# Patient Record
Sex: Male | Born: 1970 | Race: White | Hispanic: No | Marital: Married | Smoking: Current every day smoker
Health system: Southern US, Community
[De-identification: ages and names within clinical notes are randomized; demographics above are authoritative.]

## PROBLEM LIST (undated history)

## (undated) DIAGNOSIS — Z8719 Personal history of other diseases of the digestive system: Secondary | ICD-10-CM

## (undated) DIAGNOSIS — M1990 Unspecified osteoarthritis, unspecified site: Secondary | ICD-10-CM

## (undated) DIAGNOSIS — N400 Enlarged prostate without lower urinary tract symptoms: Secondary | ICD-10-CM

## (undated) DIAGNOSIS — R319 Hematuria, unspecified: Secondary | ICD-10-CM

## (undated) DIAGNOSIS — N200 Calculus of kidney: Secondary | ICD-10-CM

## (undated) DIAGNOSIS — Q8789 Other specified congenital malformation syndromes, not elsewhere classified: Secondary | ICD-10-CM

## (undated) DIAGNOSIS — F17200 Nicotine dependence, unspecified, uncomplicated: Secondary | ICD-10-CM

## (undated) HISTORY — DX: Other specified congenital malformation syndromes, not elsewhere classified: Q87.89

## (undated) HISTORY — DX: Calculus of kidney: N20.0

## (undated) HISTORY — PX: MOUTH SURGERY: SHX715

## (undated) HISTORY — PX: OTHER SURGICAL HISTORY: SHX169

## (undated) HISTORY — DX: Unspecified osteoarthritis, unspecified site: M19.90

## (undated) HISTORY — DX: Personal history of other diseases of the digestive system: Z87.19

## (undated) HISTORY — DX: Nicotine dependence, unspecified, uncomplicated: F17.200

## (undated) HISTORY — PX: HEMORRHOID SURGERY: SHX153

## (undated) HISTORY — DX: Hematuria, unspecified: R31.9

## (undated) HISTORY — DX: Benign prostatic hyperplasia without lower urinary tract symptoms: N40.0

---

## 2012-11-23 ENCOUNTER — Ambulatory Visit: Payer: Self-pay | Admitting: Emergency Medicine

## 2012-11-23 LAB — DOT URINE DIP
Glucose,UR: NEGATIVE mg/dL (ref 0–75)
Protein: NEGATIVE

## 2013-01-25 ENCOUNTER — Ambulatory Visit: Payer: Self-pay | Admitting: Surgery

## 2013-01-26 LAB — PATHOLOGY REPORT

## 2014-08-29 ENCOUNTER — Ambulatory Visit: Payer: Self-pay | Admitting: Urology

## 2014-09-15 ENCOUNTER — Ambulatory Visit: Payer: Self-pay | Admitting: Family Medicine

## 2014-09-15 LAB — DOT URINE DIP
GLUCOSE, UR: NEGATIVE
Protein: NEGATIVE
SPECIFIC GRAVITY: 1.025 (ref 1.000–1.030)

## 2014-12-01 ENCOUNTER — Ambulatory Visit: Payer: Self-pay | Admitting: Urology

## 2014-12-14 ENCOUNTER — Ambulatory Visit: Payer: Self-pay | Admitting: Urology

## 2014-12-16 DIAGNOSIS — N200 Calculus of kidney: Secondary | ICD-10-CM

## 2014-12-16 HISTORY — DX: Calculus of kidney: N20.0

## 2015-01-16 ENCOUNTER — Ambulatory Visit: Payer: Self-pay

## 2015-01-23 ENCOUNTER — Ambulatory Visit: Payer: Self-pay | Admitting: Urology

## 2015-01-23 LAB — URINALYSIS, COMPLETE
BACTERIA: NONE SEEN
Bilirubin,UR: NEGATIVE
GLUCOSE, UR: NEGATIVE mg/dL (ref 0–75)
Ketone: NEGATIVE
Leukocyte Esterase: NEGATIVE
Nitrite: NEGATIVE
Ph: 5 (ref 4.5–8.0)
Protein: NEGATIVE
RBC,UR: 31 /HPF (ref 0–5)
SPECIFIC GRAVITY: 1.018 (ref 1.003–1.030)
Squamous Epithelial: NONE SEEN

## 2015-02-06 ENCOUNTER — Ambulatory Visit: Payer: Self-pay | Admitting: Urology

## 2015-02-20 ENCOUNTER — Ambulatory Visit: Payer: Self-pay | Admitting: Urology

## 2015-03-13 DIAGNOSIS — L299 Pruritus, unspecified: Secondary | ICD-10-CM | POA: Insufficient documentation

## 2015-03-22 ENCOUNTER — Ambulatory Visit
Admit: 2015-03-22 | Disposition: A | Discharge status: Home / Self Care | Payer: Self-pay | Attending: Urology | Admitting: Urology

## 2015-04-07 NOTE — Op Note (Signed)
PATIENT NAME:  [name], MAIN MR#:  [id number] DATE OF BIRTH:  01/16/1971  DATE OF PROCEDURE:  01/25/2013  PREOPERATIVE DIAGNOSIS: Hemorrhoids.   POSTOPERATIVE DIAGNOSIS: Hemorrhoids.   PROCEDURE: Hemorrhoidectomy.   SURGEON: Rochel Brome, MD  ANESTHESIA: General.   INDICATIONS: This 44 year old male has a long history of bleeding hemorrhoids. Physical exam demonstrated large internal and external hemorrhoids and surgery was recommended for definitive treatment.   DESCRIPTION OF PROCEDURE: The patient was placed on the operating table in the supine position under general anesthesia. The legs were elevated into the lithotomy position using ankle straps. The anal area was prepared with Betadine solution and draped in a sterile manner.   Initial inspection revealed there was a large rosette of external hemorrhoids. The largest complex was on the left, the next largest was right and posterior and then next largest was right and anterior. The anoderm was infiltrated with 0.5% Sensorcaine with epinephrine. The anal canal was dilated large enough to admit four fingers. Some formed stool was evacuated. The bivalve anal retractor was introduced and the anal canal was further dilated. Multiple large internal hemorrhoids were identified and the largest internal hemorrhoids were on the patient's left.   The first internal and external hemorrhoid column to be removed was on the patient's left. A high ligation of the internal component was done with a 2-0 chromic suture ligature. A V-shaped incision was made externally and initial dissection was carried out with scissors to separate external hemorrhoids from the skin and from some surrounding subcutaneous fat. Next, further dissection was carried out with the Harmonic scalpel as the external hemorrhoids were excised. The internal anal sphincter was identified and care was taken to avoid injury to the sphincter. The dissection was carried out beyond the sphincter  up to the previously placed suture ligature. The hemorrhoid was again ligated with the same suture ligature and the hemorrhoid was excised with the Harmonic scalpel. The wound was inspected. Several small bleeding points were cauterized. The wound was closed with a running, locked tied 2-0 chromic with a small opening externally left for drainage.   Next, a similar procedure was carried out at the 8 o'clock position with similar excision, ligation and closure.  Next, a similar procedure was carried out at the 11 o'clock position, this hemorrhoid was somewhat smaller, with similar excision, ligation and closure. Two other internal hemorrhoids were ligated with 2-0 chromic. Following this it appeared hemostasis was intact. The site was further prepared with Betadine and skin and subcutaneous tissues and deeper tissues surrounding the sphincter were infiltrated with 20 mL of Exparel. Dressings were applied with paper tape. The patient tolerated surgery satisfactorily and was then prepared for transfer to the recovery room. ____________________________ Lenna Sciara. Rochel Brome, MD jws:sb D: 01/25/2013 11:25:00 ET T: 01/25/2013 11:45:09 ET JOB#: [id number]  cc: Loreli Dollar, MD, <Dictator> Loreli Dollar MD ELECTRONICALLY SIGNED 01/30/2013 20:38

## 2015-04-16 NOTE — Op Note (Signed)
**Note Wesley-Identified via Obfuscation** PATIENT NAME:  [name], [name] MR#:  [id number] DATE OF BIRTH:  1971-06-20  DATE OF PROCEDURE:  01/23/2015  PREOPERATIVE DIAGNOSIS:  Left kidney stone.   POSTOPERATIVE DIAGNOSIS:  Left kidney stone.   PROCEDURE PERFORMED: Left ureteroscopy, left ureteral stent placement.   ATTENDING SURGEON: Sherlynn Stalls, MD.   ANESTHESIA: General anesthesia.   ESTIMATED BLOOD LOSS: Minimal.   DRAINS: A 6 x 26 double-J ureteral stent on the left.   SPECIMENS: None.   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   INDICATION: This is a 44 year old male with significant left lower pole stones x 3 measuring up to 1 cm. He underwent ESWL, however these failed to fragment. He was counseled on various treatment options and has elected to proceed with left ureteroscopy for definitive management of his stones. Risks and benefits including the risk of need for multiple procedures were discussed in the preoperative holding area, to which the patient agreed.   PROCEDURE: The patient was correctly identified in the preoperative holding area and informed consent was confirmed. He was brought to the operating suite and placed on the table in the supine position. At this time universal timeout protocol was performed. All team members were identified. Venodyne boots placed and he was administered 500 mg of IV Levaquin in the perioperative period. He was then placed under general anesthesia, repositioned in the dorsal lithotomy position, and prepped and draped in standard surgical fashion. At this point in time a rigid cystoscope with a 21 French access sheath was introduced into the bladder and attention was turned to the left ureteral orifice. This was cannulated using a 5 [place] open-ended ureteral catheter and Sensor wire was able to be placed up to the level of the kidney without difficulty.  I then attempted to use a dual-lumen to introduce a second  Sensor wire which was successful up to the level of the kidney. Given the stone burden I did elect to go ahead and attempt to place a 12-14 access sheath, however met resistance at the level of the mid ureter just below the level of the iliacs. I then used the inner cannula of the access sheath alone and again was unable to pass it past this area. At this point in time due to concern for possible obstruction at this area retrograde pyelogram was performed, which revealed no obvious stenosis in this area or evidence of extravasation or proximal dilation.  I then attempted to place a flexible ureteroscope which was 6 [place] in diameter up over the working wire past this area, again which was unsuccessful. This was performed under direct visualization and also under fluoroscopic guidance without success.  I then used a needle ureteroscope which was a rigid ureteroscope to scope the distal ureter.  I was able to advance this through the distal ureter without difficulty and again I was able to visualize the area just distal to the iliac crest by which the access sheath or flexible scope failed to pass, and it appeared to be patent, however due to torquing I was unable to place the rigid scope past this area either, which was somewhat curious. I attempted several additional maneuvers, including using a Super Stiff wire as my working wire, and again ultimately was unsuccessful in getting the scope past this area. I then elected to place a 6 x 26 [place] after backloading the safety wire through the cystoscope. The stent did advance past this  area up into the level of the kidney without difficulty. The wire was partially withdrawn. A coil was noted within the renal pelvis. The wire was then fully withdrawn and coil was noted within the bladder. The bladder was then drained. The patient was repositioned in the supine position and taken to the PACU in stable condition.   PLAN: We will go ahead and plan for a staged procedure  and bring the patient back in a few weeks for ureteroscopy. I explained to both the patient and his wife that after passive ureteral dilation it is often very easy to pass the scope and it should be successful at the next procedure.     ____________________________ Sherlynn Stalls, MD ajb:bu D: 01/23/2015 14:29:03 ET T: 01/23/2015 16:15:27 ET JOB#: [id number]  cc: Sherlynn Stalls, MD, <Dictator> Sherlynn Stalls MD ELECTRONICALLY SIGNED 02/07/2015 12:35

## 2015-04-16 NOTE — Op Note (Signed)
PATIENT NAME:  [name], [name] MR#:  [id number] DATE OF BIRTH:  Apr 24, 1971  DATE OF PROCEDURE:  02/06/2015  PREOPERATIVE DIAGNOSIS: Left kidney stones.   POSTOPERATIVE DIAGNOSIS:  Left kidney stones.   PROCEDURE PERFORMED: Left ureteroscopy, laser lithotripsy, and left ureteral stent exchange.  ANESTHESIA: General anesthesia.   ATTENDING SURGEON: Sherlynn Stalls, MD  SPECIMEN: Stone fragment.   DRAINS: A 6 x 26 double-J ureteral stent on the left.   COMPLICATIONS: None.  ESTIMATED BLOOD LOSS:  Minimal.   INDICATION: This is a 44 year old male with 3 large left lower pole stones measuring 8 mm, 5 mm, and 5 mm in size. He previously underwent ESWL without any change in the size or shape of his stones. He is brought to the operating suite a few weeks ago for attempted left ureteroscopy but was unable to pass the scope beyond a ureteral stricture just below the level of the iliac crest, therefore a ureteral stent was placed. He returns today for definitive management of his stones. Risks and benefits of the procedure were explained in detail. The patient agreed to proceed as planned.   PROCEDURE: The patient was correctly identified in the preoperative holding area and informed consent was confirmed. He was brought to the operating suite and placed on the table in the supine position. At this time universal timeout protocol was performed. The team members were identified. Venodyne boots were placed and he was administered 2 grams of IV Ancef in the perioperative period. He was administered IV Levaquin in the perioperative period. He was then placed under general anesthesia, repositioned lower on the bed in the dorsal lithotomy position and prepped and draped in standard surgical fashion. At this point in time, a rigid cystoscope using the 54 French access sheath was advanced per urethra into the bladder. The bladder was surveyed. Attention was turned to the left ureteral orifice in the distal coil  of the stent was grasped and brought out to the level of the urethral meatus. This was then cannulated using a 0.038 Sensor wire up to the level of the kidney. The stent was removed and the wire was left in place. A dual-lumen access sheath was introduced just within the distal ureter and a second Super Stiff wire was introduced at this time. The Sensor wire was snapped in place and the Super Stiff was used as a working wire. A 1214 Cook access sheath was then attempted to be advanced over the wire, however, was not able to advance it beyond the level of the iliac crest in the same area of the ureter, which I had difficulty getting past on the last case. The inner lumen was then removed and I was fortunately able to pass a 10 French flexible ureteroscope beyond this area to the kidney. Of note, prior to passing the scope, I did perform a retrograde pyelogram and again did not appear to be an obvious stricture, but for whatever reason this area was quite tight and did not distend like the other portions of the ureter. Once up in the kidney all calyces were surveyed. Three large stones were identified in the lower pole of the kidney. A 273 micron fiber was then brought in and used in the settings of 0.2 joules and 50 Hz. The stone was dusted into very small pieces. This took quite some time as there was a very significant stone burden.  Dusting occurred over greater than an hour period of time. Because of the narrowed area I had  difficult time passing the stone, so I did attempt to and create as small fragments as possible. The stone fragments where either the size of the laser tip or slightly larger but there was a very large stone fragment burden.  I attempted to aspirate out some of the dust but this was not particularly successful. I then used the basket a few times to basket out pieces but given the stricture and difficulty getting the scope in and out, I was not able to this efficiently or particularly  effectively. At this point in time, I did decide to go ahead and proceed with staged procedure and allow a few weeks for him to pass much of the debris as visualization at this point was fairly poor and return in a few weeks to extract out the residual stone burden. The access sheath was removed under direct visualization and a Sensor wire was back loaded into a rigid cystoscope and a 6 x 26 French double-J ureteral stent was advanced over the wire up to the level of the kidney. The wire was partially withdrawn and a coil was noted within the renal pelvis. The wire was then fully withdrawn and the coil was noted within the bladder. The bladder was then drained. The patient was repositioned in the supine position, reversed from anesthesia and taken to the PACU in stable condition. There were no complications in this case.   ____________________________ Sherlynn Stalls, MD ajb:mc D: 02/06/2015 17:25:04 ET T: 02/07/2015 09:08:33 ET JOB#: [id number]  cc: Sherlynn Stalls, MD, <Dictator> Sherlynn Stalls MD ELECTRONICALLY SIGNED 02/22/2015 13:39

## 2015-04-16 NOTE — Op Note (Signed)
PATIENT NAME:  [name], [name] MR#:  [id number] DATE OF BIRTH:  12/09/1971  DATE OF PROCEDURE:  02/20/2015  PREOPERATIVE DIAGNOSIS: Left kidney stones.   POSTOPERATIVE DIAGNOSIS: Left kidney stones.  PROCEDURE PERFORMED: Left ureteroscopy, laser lithotripsy, left ureteral stent exchange, retrograde pyelogram   ATTENDING SURGEON: Sherlynn Stalls, MD   ANESTHESIA: General anesthesia.   ESTIMATED BLOOD LOSS: Minimal.   DRAINS: A 6 x 26 French double-J ureteral stent on the left.   COMPLICATIONS: None.   SPECIMENS: Stone fragment.   INDICATION: This is a 44 year old male who has undergone several ureteroscopic procedures for a large left stone burden. He was last in the operating room approximately 2 weeks ago and these large stones were fragmented, but there was fairly significant residual stone at the end of the case. He returns today for a staged procedure. Risks and benefits of the procedure were explained in detail with the patient who agreed to proceed as planned.   PROCEDURE: The patient was correctly identified in the preoperative holding area and informed consent was confirmed. He was brought to the operating suite and placed on the table in the supine position. At this time, a universal timeout protocol was performed. All team members were identified. Venodyne boots were placed and he was administered IV ampicillin and gentamicin in the perioperative period. He was then placed under general anesthesia, repositioned lower in the dorsal lithotomy position and prepped and draped in the standard surgical fashion. At this point in time, a rigid cystoscope using a 18 French access sheath was advanced per urethra into the bladder. Attention was turned to the left ureteral orifice from which a ureteral stent was seen emanating. The distal coil was grabbed using stent graspers and brought out to the level of the urethral meatus. The stent was then cannulated using a Sensor wire up to the level of  the kidney. The stent was then removed leaving the wire in place. This was snapped in place to be used as a working wire. A second Super Stiff wire was introduced using the cystoscope up to the level of the renal pelvis to be used as a working wire. Given the large stone burden, I did go ahead and elect to use [facility] ureteral access sheath. This was advanced over the Super Stiff wire to the level of the proximal ureter and the inner cannula was removed. A 7 French rigid ureteroscope was advanced into the kidney and the kidneys were surveyed. There were no significant stones in the upper and mid pole; however, there was a plethora of very small stone fragments within the lower pole complex. A 1.9 French Nitinol tipless basket was then used to basket a very large number of these stones, approximately 30 to 40 small stone fragments. There were no significantly large stones which were not amenable to basketing as this had been adequately fragmented at the last procedure; therefore, there was no need for laser lithotripsy today. After a lengthy basket extraction, the entire kidney was noted to be clear of any significant sized stones with a very minimal minute stone burden of extremely tiny fragments. Once this was completed, the scope was backed down to the level of the UPJ and a retrograde pyelogram was performed. This created a map to ensure that each and every calyx was directly visualized and there were no significant stones remaining. Once this was complete, the scope was removed under direct visualization as well as a ureteral access sheath and the entire ureter was examined  under direct visualization upon removal. The safety wire was then backloaded over a rigid cystoscope and a 6 x 24 French double-J ureteral stent was placed over the wire up to the level of the renal pelvis. The wire was partially withdrawn and a coil was noted within the bladder. The wire was then fully withdrawn and a coil was noted within the  bladder. The bladder was then drained. The patient was then repositioned in the supine position, reversed from anesthesia, and taken to the PACU in stable condition. There were no complications in this case.    ____________________________ Sherlynn Stalls, MD ajb:TM D: 02/20/2015 16:59:18 ET T: 02/20/2015 20:15:18 ET JOB#: [id number]  cc: Sherlynn Stalls, MD, <Dictator> Sherlynn Stalls MD ELECTRONICALLY SIGNED 03/22/2015 16:20

## 2015-07-31 ENCOUNTER — Other Ambulatory Visit: Payer: Self-pay

## 2015-07-31 DIAGNOSIS — N200 Calculus of kidney: Secondary | ICD-10-CM

## 2015-11-13 DIAGNOSIS — K219 Gastro-esophageal reflux disease without esophagitis: Secondary | ICD-10-CM | POA: Insufficient documentation

## 2015-11-20 DIAGNOSIS — Z720 Tobacco use: Secondary | ICD-10-CM | POA: Insufficient documentation

## 2015-11-20 DIAGNOSIS — IMO0002 Reserved for concepts with insufficient information to code with codable children: Secondary | ICD-10-CM | POA: Insufficient documentation

## 2016-03-26 ENCOUNTER — Encounter: Payer: Self-pay | Admitting: *Deleted

## 2016-03-26 ENCOUNTER — Ambulatory Visit
Admission: RE | Admit: 2016-03-26 | Discharge: 2016-03-26 | Disposition: A | Discharge status: Home / Self Care | Payer: BLUE CROSS/BLUE SHIELD | Source: Ambulatory Visit | Attending: Urology | Admitting: Urology

## 2016-03-26 ENCOUNTER — Ambulatory Visit (INDEPENDENT_AMBULATORY_CARE_PROVIDER_SITE_OTHER): Payer: BLUE CROSS/BLUE SHIELD | Admitting: Urology

## 2016-03-26 VITALS — BP 116/72 | HR 73 | Ht 69.5 in | Wt 232.6 lb

## 2016-03-26 DIAGNOSIS — N200 Calculus of kidney: Secondary | ICD-10-CM | POA: Insufficient documentation

## 2016-03-26 DIAGNOSIS — Z87898 Personal history of other specified conditions: Secondary | ICD-10-CM | POA: Insufficient documentation

## 2016-03-26 DIAGNOSIS — Z87442 Personal history of urinary calculi: Secondary | ICD-10-CM

## 2016-03-26 DIAGNOSIS — Q8501 Neurofibromatosis, type 1: Secondary | ICD-10-CM | POA: Insufficient documentation

## 2016-03-26 LAB — URINALYSIS, COMPLETE
BILIRUBIN UA: NEGATIVE
Glucose, UA: NEGATIVE
KETONES UA: NEGATIVE
Leukocytes, UA: NEGATIVE
Nitrite, UA: NEGATIVE
PH UA: 7.5 (ref 5.0–7.5)
Protein, UA: NEGATIVE
RBC UA: NEGATIVE
Specific Gravity, UA: 1.02 (ref 1.005–1.030)
UUROB: 0.2 mg/dL (ref 0.2–1.0)

## 2016-03-26 LAB — MICROSCOPIC EXAMINATION
Bacteria, UA: NONE SEEN
EPITHELIAL CELLS (NON RENAL): NONE SEEN /HPF (ref 0–10)
RBC, UA: NONE SEEN /hpf (ref 0–?)

## 2016-03-26 NOTE — Progress Notes (Signed)
03/26/2016 5:56 PM   [name]. 30-Jan-1971 [id number]  Referring provider: No referring provider defined for this encounter.  Chief Complaint  Patient presents with  . Follow-up    renal stone    HPI: 45 year old male who returns approximately one year following left ureteroscopy, laser lithotripsy and failed a left ESWL for fairly significant left-sided stone burden. He did have follow-up renal ultrasound which was negative for residual stones and hydronephrosis. Stone analysis was consistent with 95% calcium oxalate monohydrate, 2% calcium oxalate dihydrate, and 2% calcium phosphate stones.  Today, he has no complains. He experienced no further flank pain.  No recent gross hematuria.  He continues to not drink enough water and has not been checked her collate adherent to a stone diet. 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  KUB today shows no new stone formation.      PMH: Past Medical History  Diagnosis Date  . Neurofibromatosis type 1-like syndrome   . Hematuria     microscopic  . Arthritis   . History of hemorrhoids   . Kidney stones   . Smoker   . BPH (benign prostatic hyperplasia)     Surgical History: Past Surgical History  Procedure Laterality Date  . Acl reconstructed    . Hemorrhoid surgery    . Mouth surgery      Home Medications:    Medication List       This list is accurate as of: 03/26/16  5:56 PM.  Always use your most recent med list.               atorvastatin 40 MG tablet  Commonly known as:  LIPITOR  Take by mouth.     Cranberry 500 MG Caps  Take by mouth.     gabapentin 100 MG capsule  Commonly known as:  NEURONTIN     Turmeric Curcumin 500 MG Caps  Take by mouth.        Allergies: No Known Allergies  Family History: Family History  Problem Relation Age of Onset  . Stroke Father   . Depression Mother   . Coronary artery disease      mother,father,maternal side,paternal side    . Diabetes Mellitus II Father     Social History:  reports that he has been smoking.  He does not have any smokeless tobacco history on file. He reports that he drinks alcohol. His drug history is not on file.  ROS: UROLOGY Frequent Urination?: No Hard to postpone urination?: No Burning/pain with urination?: No Get up at night to urinate?: No Leakage of urine?: No Urine stream starts and stops?: Yes Trouble starting stream?: No Do you have to strain to urinate?: No Blood in urine?: No Urinary tract infection?: No Sexually transmitted disease?: No Injury to kidneys or bladder?: No Painful intercourse?: No Weak stream?: No Erection problems?: No Penile pain?: No  Gastrointestinal Nausea?: No Vomiting?: No Indigestion/heartburn?: No Diarrhea?: No Constipation?: No  Constitutional Fever: No Night sweats?: No Weight loss?: No Fatigue?: No  Skin Skin rash/lesions?: No Itching?: No  Eyes Blurred vision?: No Double vision?: No  Ears/Nose/Throat Sore throat?: No Sinus problems?: No  Hematologic/Lymphatic Swollen glands?: No Easy bruising?: No  Cardiovascular Leg swelling?: No Chest pain?: No  Respiratory Cough?: No Shortness of breath?: No  Endocrine Excessive thirst?: No  Musculoskeletal Back pain?: No Joint pain?: No  Neurological Headaches?: No Dizziness?: No  Psychologic Depression?: No Anxiety?: No  Physical  Exam: BP 116/72 mmHg  Pulse 73  Ht 5' 9.5" (1.765 m)  Wt 232 lb 9.6 oz (105.507 kg)  BMI 33.87 kg/m2  Constitutional:  Alert and oriented, No acute distress. HEENT: [place] AT, moist mucus membranes.  Trachea midline, no masses. Cardiovascular: No clubbing, cyanosis, or edema. Respiratory: Normal respiratory effort, no increased work of breathing. GI: Abdomen is soft, nontender, nondistended, no abdominal masses GU: No CVA tenderness Skin: No rashes, bruises or suspicious lesions. Neurologic: Grossly intact, no focal deficits,  moving all 4 extremities. Psychiatric: Normal mood and affect.  Laboratory Data:  Urinalysis Results for orders placed or performed in visit on 03/26/16  Microscopic Examination  Result Value Ref Range   WBC, UA 0-5 0 -  5 /hpf   RBC, UA None seen 0 -  2 /hpf   Epithelial Cells (non renal) None seen 0 - 10 /hpf   Crystals Present (A) N/A   Crystal Type Amorphous Sediment N/A   Bacteria, UA None seen None seen/Few  Urinalysis, Complete  Result Value Ref Range   Specific Gravity, UA 1.020 1.005 - 1.030   pH, UA 7.5 5.0 - 7.5   Color, UA Yellow Yellow   Appearance Ur Cloudy (A) Clear   Leukocytes, UA Negative Negative   Protein, UA Negative Negative/Trace   Glucose, UA Negative Negative   Ketones, UA Negative Negative   RBC, UA Negative Negative   Bilirubin, UA Negative Negative   Urobilinogen, Ur 0.2 0.2 - 1.0 mg/dL   Nitrite, UA Negative Negative   Microscopic Examination See below:     Pertinent Imaging:  Study Result     CLINICAL DATA: 45 year old male with history kidney stones. Follow-up. No pain or hematuria currently. Subsequent encounter.  EXAM: ABDOMEN - 1 VIEW  COMPARISON: 01/16/2015.  FINDINGS: No renal or ureteral calculi noted.  Normal bowel gas pattern.  IMPRESSION: No renal or ureteral calculi noted.   Electronically Signed  By: Genia Del M.D.  On: 03/26/2016 15:56        Assessment & Plan:    1. History of nephrolithiasis No recent flank pain or evidence of recurrent stone formation of the past year. Again, reviewed stone diet and importance of adequate hydration.   KUB reviewed today with patient.  He will lfollow-up as needed.  - Urinalysis, Complete    Hollice Espy, MD  [facility] [address] [place], [place] [address] [phone]

## 2016-03-27 ENCOUNTER — Ambulatory Visit: Payer: Self-pay | Admitting: Urology

## 2016-03-29 ENCOUNTER — Ambulatory Visit: Payer: Self-pay | Admitting: Urology

## 2016-07-22 ENCOUNTER — Encounter: Payer: Self-pay | Admitting: *Deleted

## 2016-07-22 ENCOUNTER — Ambulatory Visit
Admission: EM | Admit: 2016-07-22 | Discharge: 2016-07-22 | Disposition: A | Discharge status: Home / Self Care | Payer: BLUE CROSS/BLUE SHIELD | Attending: Family Medicine | Admitting: Family Medicine

## 2016-07-22 DIAGNOSIS — Z0289 Encounter for other administrative examinations: Secondary | ICD-10-CM

## 2016-07-22 LAB — DEPT OF TRANSP DIPSTICK, URINE (ARMC ONLY)
GLUCOSE, UA: NEGATIVE mg/dL
Hgb urine dipstick: NEGATIVE
PROTEIN: NEGATIVE mg/dL
Specific Gravity, Urine: 1.015 (ref 1.005–1.030)

## 2016-07-22 NOTE — Discharge Instructions (Signed)
Follow-up with primary care physician as scheduled, dentist, eye doctor.

## 2016-07-22 NOTE — ED Provider Notes (Signed)
[facility]    CSN: [id number] Arrival date & time: 07/22/16  1011  First Provider Contact:  None       History   Chief Complaint Chief Complaint  Patient presents with  . Commercial Driver's License Exam    HPI [name]. is a 45 y.o. male.   HPI Patient here for DOT physical. Has no acute concerns at this time.  Past Medical History:  Diagnosis Date  . Arthritis   . BPH (benign prostatic hyperplasia)   . Hematuria    microscopic  . History of hemorrhoids   . Kidney stones   . Neurofibromatosis type 1-like syndrome   . Smoker     Patient Active Problem List   Diagnosis Date Noted  . H/O disease 03/26/2016  . 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 Adult BMI 30+ 11/20/2015  . Current tobacco use 11/20/2015  . Acid reflux 11/13/2015  . Itch 03/13/2015    Past Surgical History:  Procedure Laterality Date  . acl reconstructed    . HEMORRHOID SURGERY    . MOUTH SURGERY         Home Medications    Prior to Admission medications   Medication Sig Start Date End Date Taking? Authorizing Provider  atorvastatin (LIPITOR) 40 MG tablet Take by mouth. 08/14/15 08/13/16 Yes Historical Provider, MD  Cranberry 500 MG CAPS Take by mouth.   Yes Historical Provider, MD  diclofenac (VOLTAREN) 75 MG EC tablet Take 75 mg by mouth 2 (two) times daily.   Yes Historical Provider, MD  Turmeric Curcumin 500 MG CAPS Take by mouth.   Yes Historical Provider, MD  gabapentin (NEURONTIN) 100 MG capsule  03/13/15   Historical Provider, MD    Family History Family History  Problem Relation Age of Onset  . Stroke Father   . Diabetes Mellitus II Father   . Depression Mother   . Coronary artery disease      mother,father,maternal side,paternal side    Social History Social History  Substance Use Topics  . Smoking status: Current Every Day Smoker  . Smokeless tobacco: Never Used  . Alcohol use 0.0 oz/week     Comment: Beer     Allergies     Review of patient's allergies indicates no known allergies.   Review of Systems Review of Systems negative except mentioned above.   Physical Exam Triage Vital Signs ED Triage Vitals [07/22/16 1130]  Enc Vitals Group     BP (!) 128/91     Pulse Rate 67     Resp 16     Temp 97.7 F (36.5 C)     Temp Source Oral     SpO2 98 %     Weight 228 lb (103.4 kg)     Height 5' 11"  (1.803 m)     Head Circumference      Peak Flow      Pain Score      Pain Loc      Pain Edu?      Excl. in [place]?    No data found.   Updated Vital Signs BP (!) 128/91 (BP Location: Left Arm)   Pulse 67   Temp 97.7 F (36.5 C) (Oral)   Resp 16   Ht 5' 11"  (1.803 m)   Wt 228 lb (103.4 kg)   SpO2 98%   BMI 31.80 kg/m        Physical Exam: Exam on form.   UC Treatments / Results  Labs (all labs ordered are listed, but only abnormal results are displayed) Labs Reviewed  DEPT OF TRANSP DIPSTICK, URINE([facility] ONLY)    EKG  EKG Interpretation None       Radiology No results found.  Procedures Procedures (including critical care time)  Medications Ordered in UC Medications - No data to display   Initial Impression / Assessment and Plan / UC Course  I have reviewed the triage vital signs and the nursing notes.  Pertinent labs & imaging results that were available during my care of the patient were reviewed by me and considered in my medical decision making (see chart for details).  Clinical Course   A/P: DOT physical- form reviewed and filled out. Recommend follow up with primary care physician, dentist, eye doctor on a regular basis.  Final Clinical Impressions(s) / UC Diagnoses   Final diagnoses:  Encounter for examination required by Department of Transportation (DOT)    New Prescriptions New Prescriptions   No medications on file     Paulina Fusi, MD 07/22/16 1250

## 2016-07-22 NOTE — ED Triage Notes (Signed)
Here for DOT physical.

## 2016-08-19 IMAGING — CR DG ABDOMEN 1V
1 series · 1 of 1 positions shown · non-contrast
Comparison: 12/14/2014

CLINICAL DATA: Kidney stones with lithotripsy late 6027. Evaluate
for migration.

EXAM:
ABDOMEN - 1 VIEW

[kdxr kidney ureter bladder]
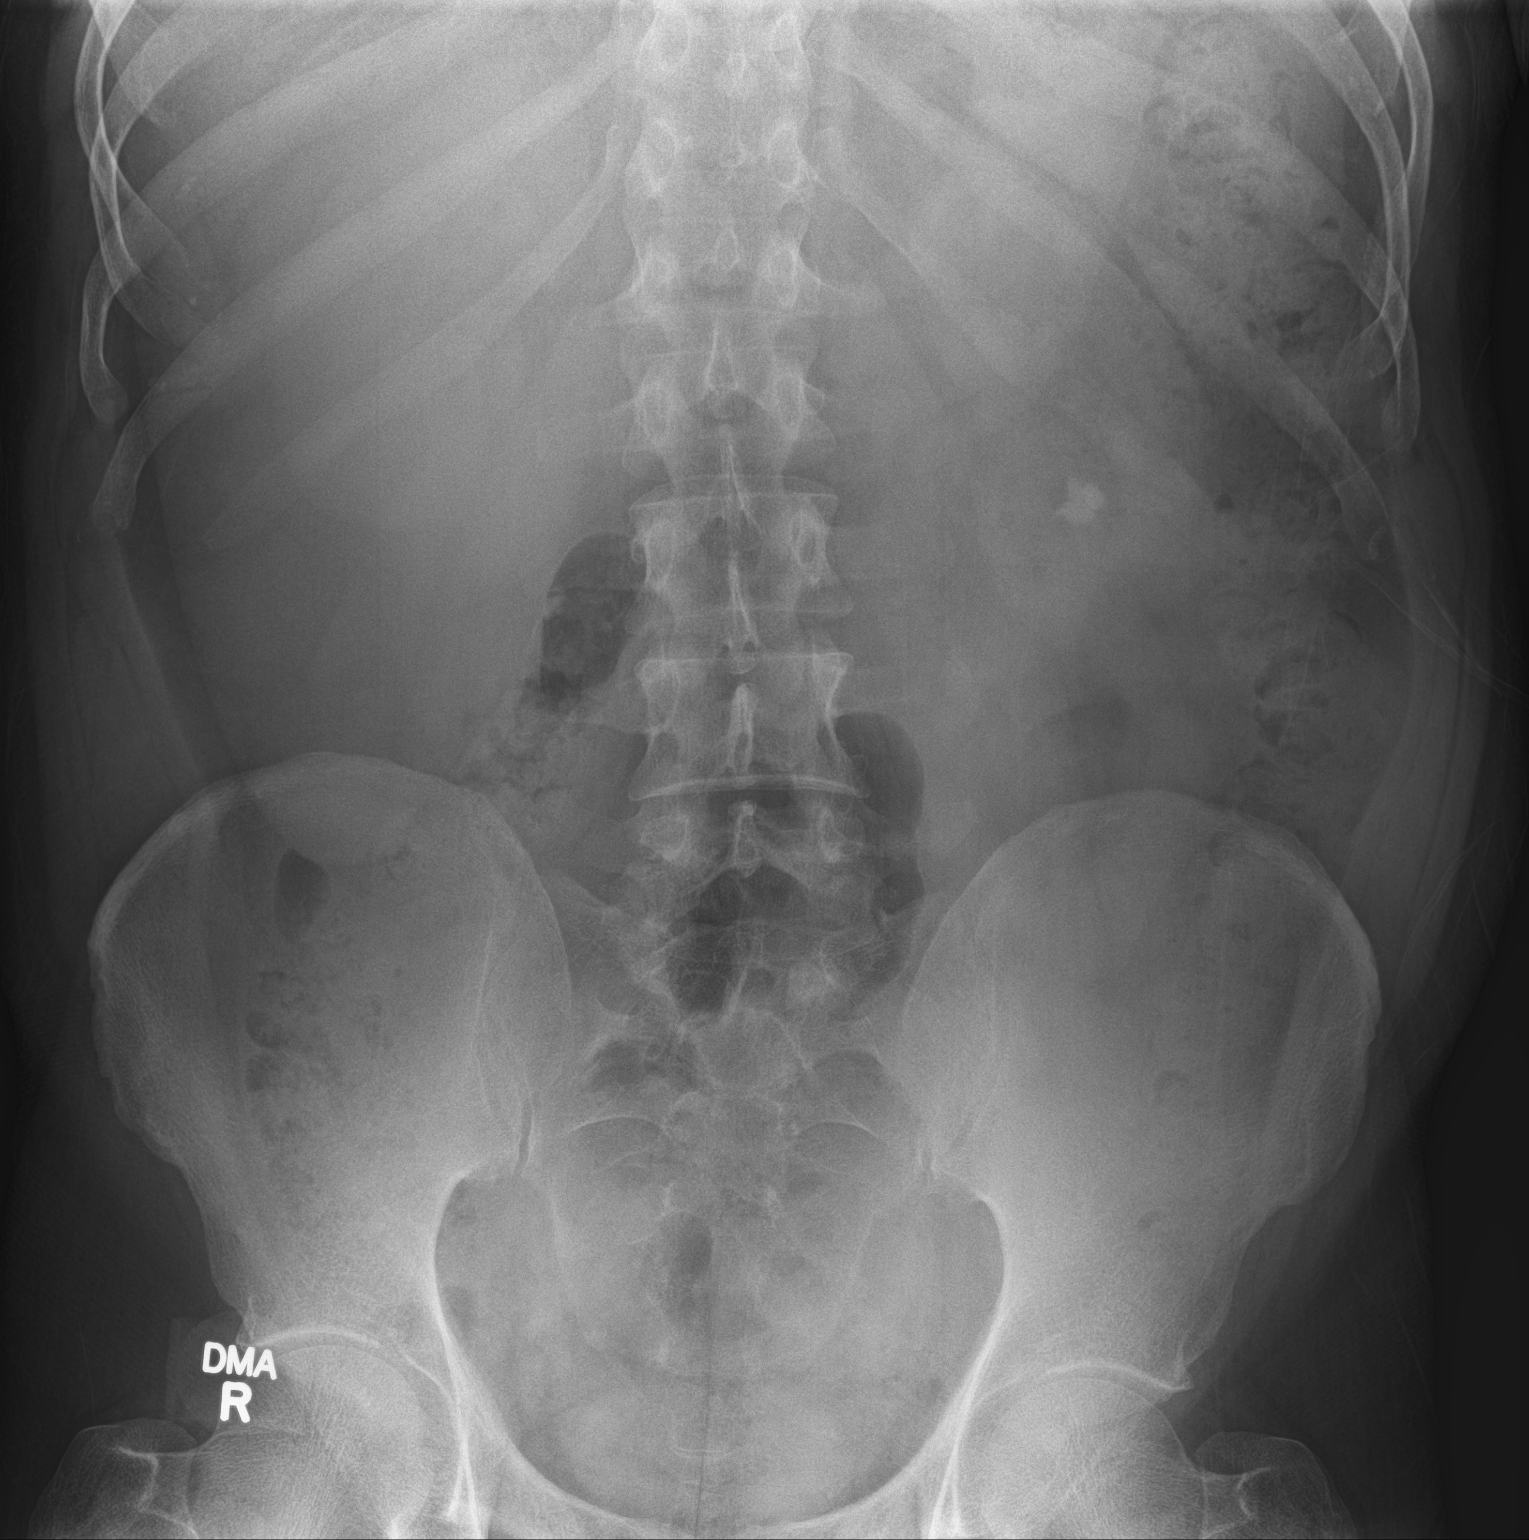

[1 of 1 positions shown; findings below may reference images not displayed]

FINDINGS: Three calculi still project over the left lower kidney. In
conglomerate, the stones measure 1 cm in maximal diameter. These are
more closely associated currently, but no evidence of fragmentation
or ureteral migration. No right-sided urolithiasis.

No incidental findings in the remainder of the abdomen.
IMPRESSION: No fragmentation or distal migration of 3 left renal calculi.

## 2016-10-23 IMAGING — US US RENAL KIDNEY
1 series · 14 of 25 positions shown · non-contrast
Comparison: [facility] January 16, 2015

CLINICAL DATA: Kidney stones with most recent stone retrieval
performed in Saturday February, 2015 ; has had a ureteral stent in the past on
the left.

EXAM:
RENAL/URINARY TRACT ULTRASOUND COMPLETE

[Series 1: us renal kidney · 0.28mm/px · 14 of 37 slices shown]
[im 1/37]
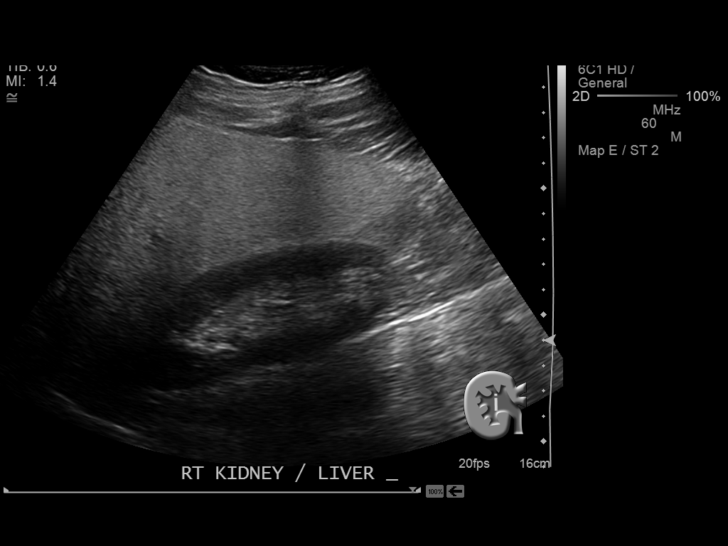
[im 4/37]
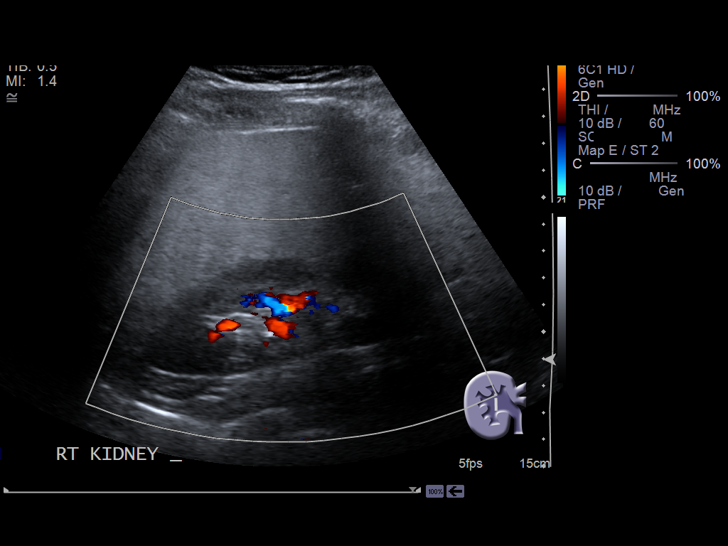
[im 7/37]
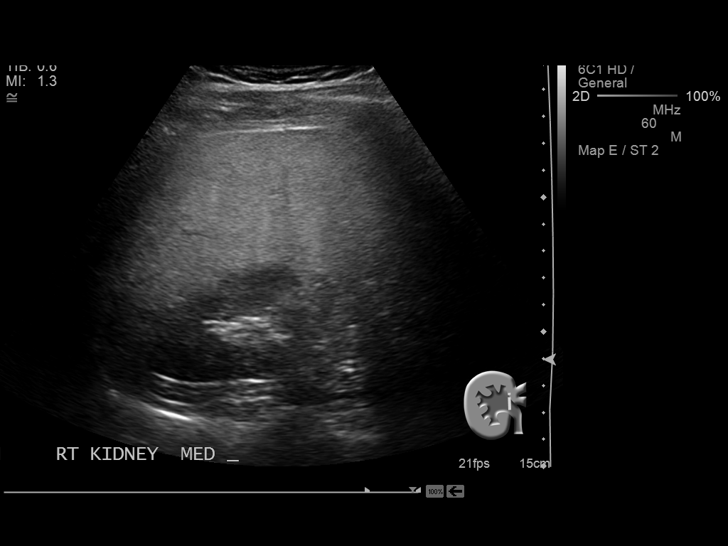
[im 10/37]
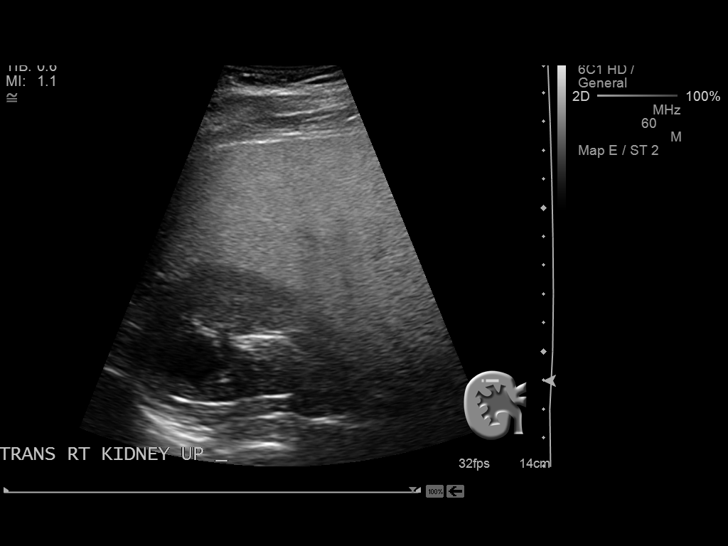
[im 13/37]
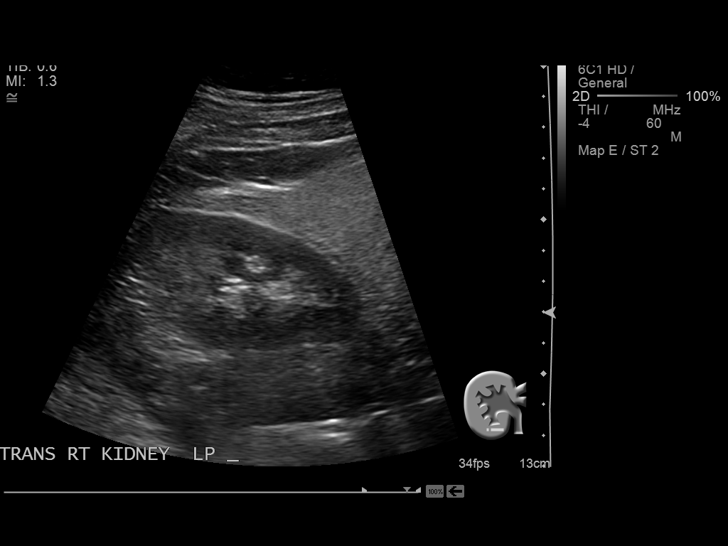
[im 14/37]
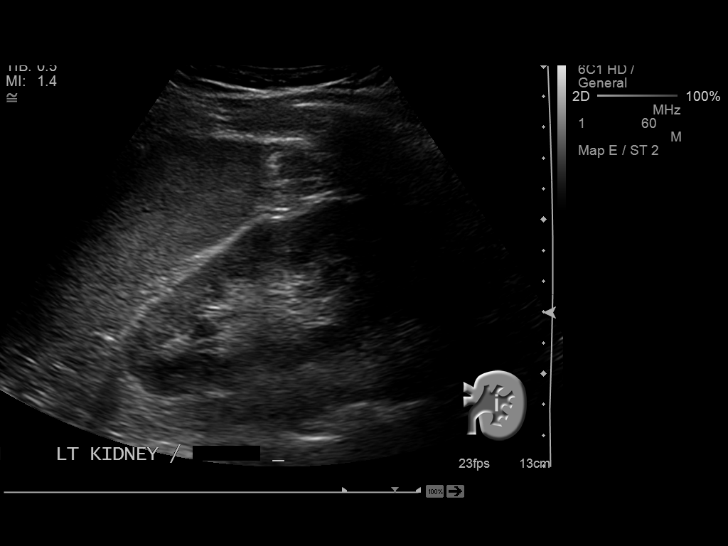
[im 17/37]
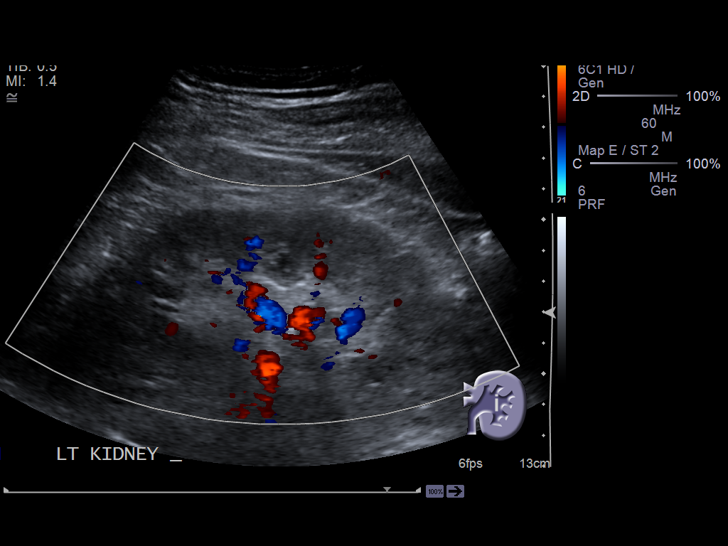
[im 20/37]
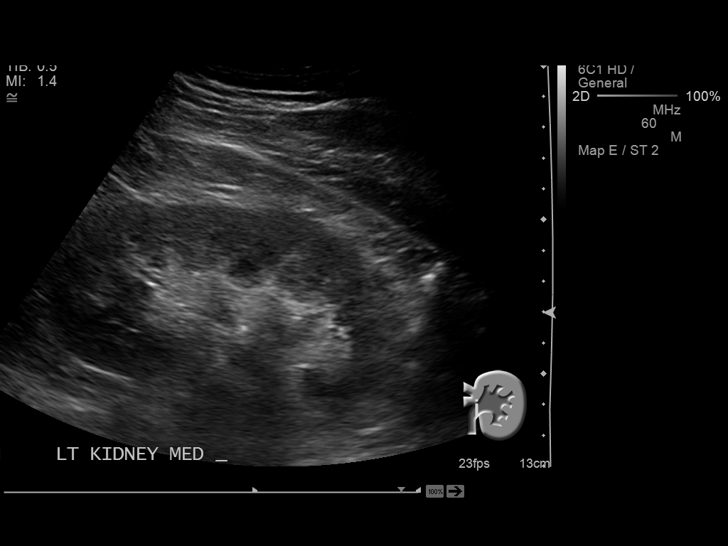
[im 23/37]
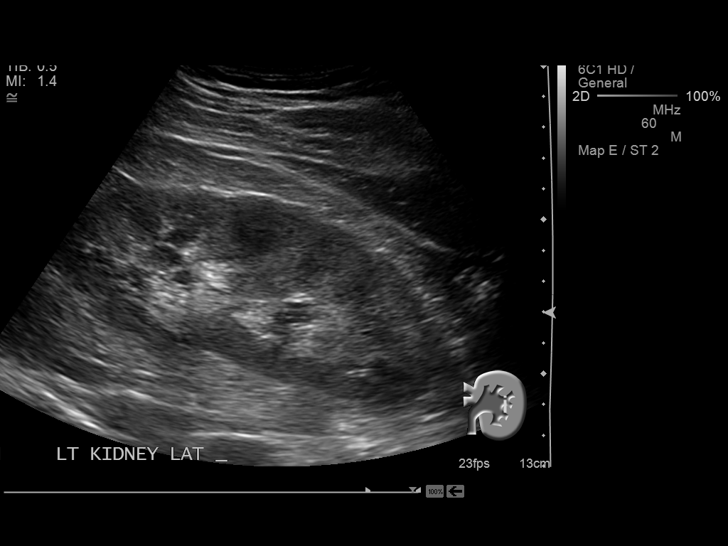
[im 25/37]
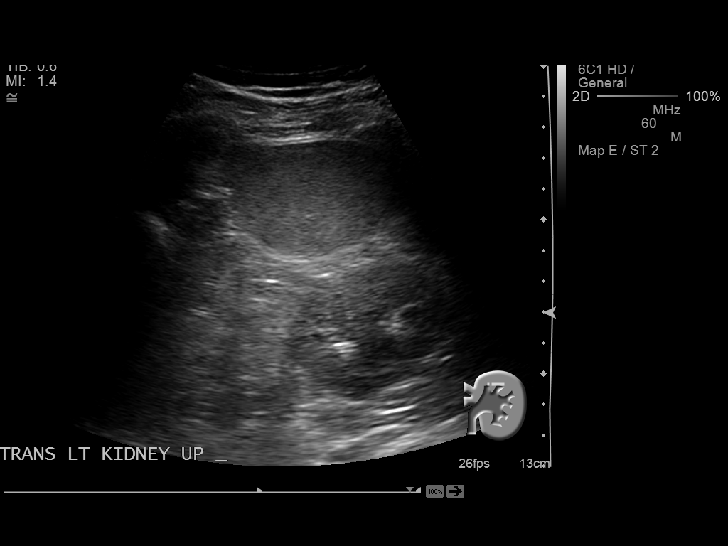
[im 28/37]
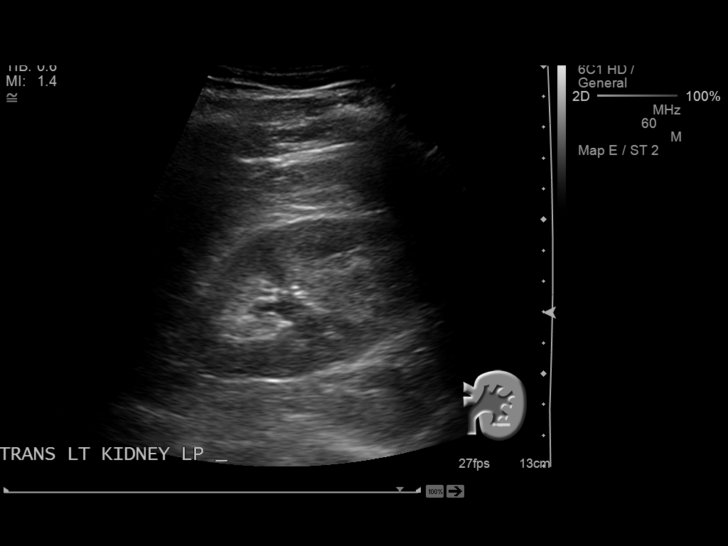
[im 31/37]
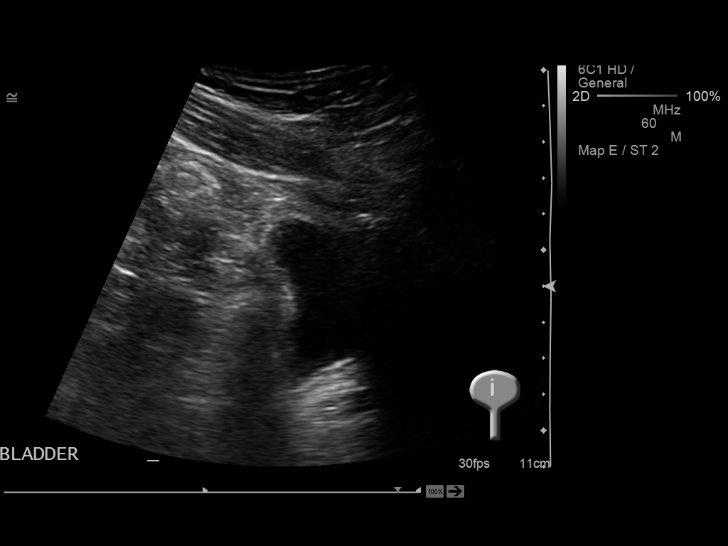
[im 34/37]
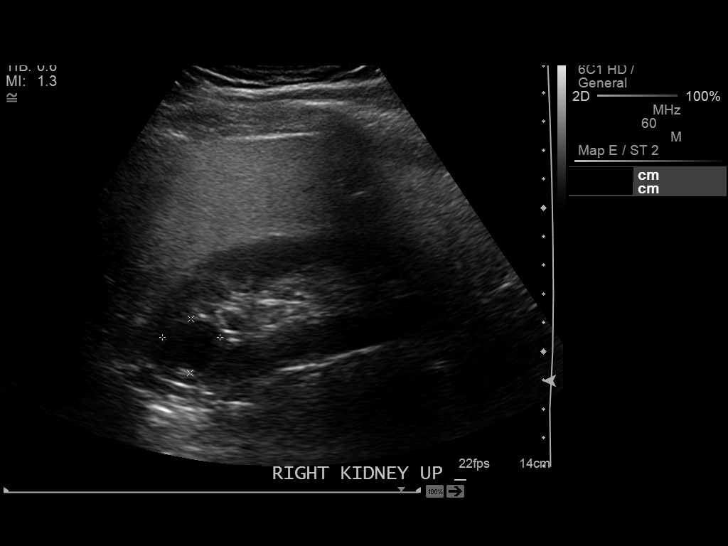
[im 37/37]
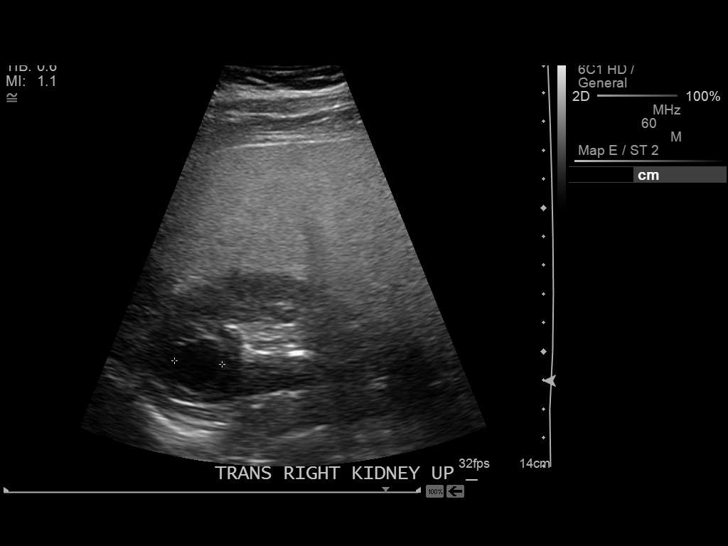

[14 of 25 positions shown; findings below may reference images not displayed]

FINDINGS: Right Kidney:

Length: 11.7 cm. The renal cortical echotexture is normal. There is
no hydronephrosis. There is an upper pole cyst measuring 2 cm
diameter unchanged since a CT scan of August 2014

Left Kidney:

Length: 12.5 cm. The renal cortical echotexture is normal. No
shadowing stones are evident. There is no hydronephrosis.

Bladder:

The partially distended urinary bladder is unremarkable.
IMPRESSION: 1. No residual kidney stones on the left are demonstrated. There are
no stones on the right. There is no hydronephrosis.
2. Simple appearing 2 cm upper pole cyst in the right kidney.

## 2018-03-30 ENCOUNTER — Other Ambulatory Visit: Payer: Self-pay | Admitting: Gastroenterology

## 2018-03-30 DIAGNOSIS — R7989 Other specified abnormal findings of blood chemistry: Secondary | ICD-10-CM

## 2018-03-30 DIAGNOSIS — R945 Abnormal results of liver function studies: Principal | ICD-10-CM

## 2018-04-06 ENCOUNTER — Inpatient Hospital Stay: Payer: BLUE CROSS/BLUE SHIELD

## 2018-04-06 ENCOUNTER — Inpatient Hospital Stay: Payer: BLUE CROSS/BLUE SHIELD | Attending: Oncology | Admitting: Oncology

## 2018-04-06 ENCOUNTER — Ambulatory Visit
Admission: RE | Admit: 2018-04-06 | Discharge: 2018-04-06 | Disposition: A | Discharge status: Home / Self Care | Payer: BLUE CROSS/BLUE SHIELD | Source: Ambulatory Visit | Attending: Gastroenterology | Admitting: Gastroenterology

## 2018-04-06 ENCOUNTER — Encounter: Payer: Self-pay | Admitting: Oncology

## 2018-04-06 VITALS — BP 134/84 | HR 98 | Temp 97.9°F | Resp 18 | Ht 71.0 in | Wt 232.8 lb

## 2018-04-06 DIAGNOSIS — R948 Abnormal results of function studies of other organs and systems: Secondary | ICD-10-CM | POA: Insufficient documentation

## 2018-04-06 DIAGNOSIS — K760 Fatty (change of) liver, not elsewhere classified: Secondary | ICD-10-CM | POA: Insufficient documentation

## 2018-04-06 DIAGNOSIS — M129 Arthropathy, unspecified: Secondary | ICD-10-CM | POA: Diagnosis not present

## 2018-04-06 DIAGNOSIS — R945 Abnormal results of liver function studies: Secondary | ICD-10-CM | POA: Diagnosis present

## 2018-04-06 DIAGNOSIS — Z87442 Personal history of urinary calculi: Secondary | ICD-10-CM | POA: Diagnosis not present

## 2018-04-06 DIAGNOSIS — F17210 Nicotine dependence, cigarettes, uncomplicated: Secondary | ICD-10-CM | POA: Diagnosis not present

## 2018-04-06 DIAGNOSIS — D72824 Basophilia: Secondary | ICD-10-CM

## 2018-04-06 DIAGNOSIS — D709 Neutropenia, unspecified: Secondary | ICD-10-CM | POA: Diagnosis not present

## 2018-04-06 DIAGNOSIS — Q8501 Neurofibromatosis, type 1: Secondary | ICD-10-CM | POA: Diagnosis not present

## 2018-04-06 DIAGNOSIS — D751 Secondary polycythemia: Secondary | ICD-10-CM

## 2018-04-06 DIAGNOSIS — N400 Enlarged prostate without lower urinary tract symptoms: Secondary | ICD-10-CM

## 2018-04-06 DIAGNOSIS — D72829 Elevated white blood cell count, unspecified: Secondary | ICD-10-CM | POA: Insufficient documentation

## 2018-04-06 DIAGNOSIS — R7989 Other specified abnormal findings of blood chemistry: Secondary | ICD-10-CM

## 2018-04-06 DIAGNOSIS — D72821 Monocytosis (symptomatic): Secondary | ICD-10-CM | POA: Diagnosis not present

## 2018-04-06 DIAGNOSIS — Z79899 Other long term (current) drug therapy: Secondary | ICD-10-CM | POA: Diagnosis not present

## 2018-04-06 DIAGNOSIS — D729 Disorder of white blood cells, unspecified: Secondary | ICD-10-CM

## 2018-04-06 LAB — CBC WITH DIFFERENTIAL/PLATELET
BASOS ABS: 0.1 10*3/uL (ref 0–0.1)
BASOS PCT: 1 %
EOS ABS: 0.2 10*3/uL (ref 0–0.7)
Eosinophils Relative: 2 %
HCT: 48.1 % (ref 40.0–52.0)
HEMOGLOBIN: 16.9 g/dL (ref 13.0–18.0)
Lymphocytes Relative: 21 %
Lymphs Abs: 2.7 10*3/uL (ref 1.0–3.6)
MCH: 30.1 pg (ref 26.0–34.0)
MCHC: 35.2 g/dL (ref 32.0–36.0)
MCV: 85.6 fL (ref 80.0–100.0)
Monocytes Absolute: 1.1 10*3/uL — ABNORMAL HIGH (ref 0.2–1.0)
Monocytes Relative: 9 %
NEUTROS PCT: 67 %
Neutro Abs: 8.6 10*3/uL — ABNORMAL HIGH (ref 1.4–6.5)
Platelets: 245 10*3/uL (ref 150–440)
RBC: 5.62 MIL/uL (ref 4.40–5.90)
RDW: 13.6 % (ref 11.5–14.5)
WBC: 12.7 10*3/uL — AB (ref 3.8–10.6)

## 2018-04-06 LAB — PATHOLOGIST SMEAR REVIEW

## 2018-04-07 LAB — ERYTHROPOIETIN: ERYTHROPOIETIN: 6.3 m[IU]/mL (ref 2.6–18.5)

## 2018-04-08 LAB — COMP PANEL: LEUKEMIA/LYMPHOMA

## 2018-04-09 NOTE — Progress Notes (Signed)
Hematology/Oncology Consult note [facility] Telephone:([phone] Fax:([phone]) [phone]  Patient Care Team: Valera Castle, MD as PCP - General (Family Medicine)   Name of the patient: [name]  [id number]  10/13/1971    Reason for referral- leucocytosis   Referring physician- Laurine Blazer NP  Date of visit: 04/09/18   History of presenting illness-patient is a 47 year old male with a past medical history significant for neurofibromatosis type I who was seen by [facility] for abnormal LFTs.  At that time he was noted to have elevated white count and has been referred to [place] for further evaluation.  Patient smokes about half a pack of cigarettes per day for the last 25 years.  Patient has had chronically elevated WBC count since 2016 which has been gradually trending up from 11.7 in 20 16-12 0.7-13.1 and most recently his CBC from 03/30/2018 showed white count of 14.6, H&H of 17/49.2 and a platelet count of 257.  Differential mainly showed neutrophilia and on occasions he has had some basal failure and monocytosis.  His hemoglobin has been typically ranging between 16.5-17.  He follows up with Dr. Melrose Nakayama from neurology for neurofibromatosis type I.  He is otherwise doing well and denies any complaints today.  He is a Administrator by profession  ECOG PS- 0  Pain scale- 0   Review of systems- Review of Systems  Constitutional: Negative for chills, fever, malaise/fatigue and weight loss.  HENT: Negative for congestion, ear discharge and nosebleeds.   Eyes: Negative for blurred vision.  Respiratory: Negative for cough, hemoptysis, sputum production, shortness of breath and wheezing.   Cardiovascular: Negative for chest pain, palpitations, orthopnea and claudication.  Gastrointestinal: Negative for abdominal pain, blood in stool, constipation, diarrhea, heartburn, melena, nausea and vomiting.  Genitourinary: Negative for dysuria, flank pain,  frequency, hematuria and urgency.  Musculoskeletal: Negative for back pain, joint pain and myalgias.  Skin: Negative for rash.  Neurological: Negative for dizziness, tingling, focal weakness, seizures, weakness and headaches.  Endo/Heme/Allergies: Does not bruise/bleed easily.  Psychiatric/Behavioral: Negative for depression and suicidal ideas. The patient does not have insomnia.     No Known Allergies  Patient Active Problem List   Diagnosis Date Noted  . H/O disease 03/26/2016  . 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 Adult BMI 30+ 11/20/2015  . Current tobacco use 11/20/2015  . Acid reflux 11/13/2015  . Itch 03/13/2015     Past Medical History:  Diagnosis Date  . Arthritis   . BPH (benign prostatic hyperplasia)   . Hematuria    microscopic  . History of hemorrhoids   . Kidney stone 2016  . Neurofibromatosis type 1-like syndrome   . Smoker      Past Surgical History:  Procedure Laterality Date  . acl reconstructed    . HEMORRHOID SURGERY    . MOUTH SURGERY    . neuro fiber mitosis      Social History   Socioeconomic History  . Marital status: Married    Spouse name: Not on file  . Number of children: Not on file  . Years of education: Not on file  . Highest education level: Not on file  Occupational History  . Not on file  Social Needs  . Financial resource strain: Not on file  . Food insecurity:    Worry: Not on file    Inability: Not on file  . Transportation needs:    Medical: Not on file    Non-medical: Not  on file  Tobacco Use  . Smoking status: Current Every Day Smoker  . Smokeless tobacco: Never Used  Substance and Sexual Activity  . Alcohol use: Yes    Alcohol/week: 0.0 oz    Comment: Beer  . Drug use: Not on file  . Sexual activity: Not on file  Lifestyle  . Physical activity:    Days per week: Not on file    Minutes per session: Not on file  . Stress: Not on file  Relationships  . Social connections:    Talks on phone: Not  on file    Gets together: Not on file    Attends religious service: Not on file    Active member of club or organization: Not on file    Attends meetings of clubs or organizations: Not on file    Relationship status: Not on file  . Intimate partner violence:    Fear of current or ex partner: Not on file    Emotionally abused: Not on file    Physically abused: Not on file    Forced sexual activity: Not on file  Other Topics Concern  . Not on file  Social History Narrative  . Not on file     Family History  Problem Relation Age of Onset  . Stroke Father   . Diabetes Mellitus II Father   . Depression Mother   . Coronary artery disease Unknown        mother,father,maternal side,paternal side     Current Outpatient Medications:  .  atorvastatin (LIPITOR) 40 MG tablet, Take by mouth., Disp: , Rfl:  .  Cranberry 500 MG CAPS, Take by mouth., Disp: , Rfl:  .  hydrOXYzine (ATARAX/VISTARIL) 25 MG tablet, Take 25 mg by mouth daily., Disp: , Rfl:  .  MULTIPLE VITAMINS PO, Take 1 tablet by mouth daily., Disp: , Rfl:  .  nortriptyline (PAMELOR) 50 MG capsule, Take 50 mg by mouth at bedtime., Disp: , Rfl:  .  diclofenac (VOLTAREN) 75 MG EC tablet, Take 75 mg by mouth 2 (two) times daily., Disp: , Rfl:  .  gabapentin (NEURONTIN) 100 MG capsule, , Disp: , Rfl:  .  Turmeric Curcumin 500 MG CAPS, Take by mouth., Disp: , Rfl:    Physical exam:  Vitals:   04/06/18 1323  BP: 134/84  Pulse: 98  Resp: 18  Temp: 97.9 F (36.6 C)  TempSrc: Tympanic  SpO2: 96%  Weight: 232 lb 12.9 oz (105.6 kg)  Height: 5' 11"  (1.803 m)   Physical Exam  Constitutional: He is oriented to person, place, and time. He appears well-developed and well-nourished.  HENT:  Head: Normocephalic and atraumatic.  Eyes: Pupils are equal, round, and reactive to light. EOM are normal.  Neck: Normal range of motion.  Cardiovascular: Normal rate, regular rhythm and normal heart sounds.  Pulmonary/Chest: Effort normal and  breath sounds normal.  Abdominal: Soft. Bowel sounds are normal.  No palpable splenomegaly  Lymphadenopathy:  No palpable cervical, supraclavicular, axillary or inguinal adenopathy  Neurological: He is alert and oriented to person, place, and time.  Skin: Skin is warm and dry.       No flowsheet data found. CBC Latest Ref Rng & Units 04/06/2018  WBC 3.8 - 10.6 K/uL 12.7(H)  Hemoglobin 13.0 - 18.0 g/dL 16.9  Hematocrit 40.0 - 52.0 % 48.1  Platelets 150 - 440 K/uL 245    No images are attached to the encounter.  US Abdomen Limited Ruq  Result Date: 04/06/2018  CLINICAL DATA:  47 year old male with elevated liver function studies. Initial encounter. EXAM: ULTRASOUND ABDOMEN LIMITED RIGHT UPPER QUADRANT COMPARISON:  08/29/2014 CT. FINDINGS: Gallbladder: No gallstones or wall thickening visualized. No sonographic Murphy sign noted by sonographer. Common bile duct: Diameter: 4.3 mm Liver: Diffuse increased echogenicity and enlargement consistent with enlarged fatty liver as noted on prior CT. Focal fatty sparing adjacent to gallbladder fossa without worrisome hepatic lesion noted. Portal vein is patent on color Doppler imaging with normal direction of blood flow towards the liver. IMPRESSION: Enlarged fatty liver. Focal fatty sparing adjacent to the gallbladder fossa. No other liver lesion noted. Gallbladder unremarkable. Electronically Signed   By: Genia Del M.D.   On: 04/06/2018 09:57    Assessment and plan- Patient is a 47 y.o. male referred to [place] for leukocytosis  On review of prior CBCs patient is a mildly elevated white count which is gradually trending up from 11-14.  Differential mainly shows neutrophilia on some occasions he has had basophilia and monocytosis.  Today I will check CBC with differential, pathology smear review, BCR able and peripheral flow cytometry testing.  Given that he has mild polycythemia I will also check EPO and Jak 2 levels.  I suspect that his leukocytosis  which is mainly neutrophilia and mild polycythemia is secondary to his ongoing smoking   I will see him back in 2 weeks to discuss the results of his blood work and further management   Thank you for this kind referral and the opportunity to participate in the care of this patient   Visit Diagnosis 1. Neutrophilia   2. Basophilia   3. Polycythemia, secondary     Dr. Randa Evens, MD, MPH [facility] [phone] 04/09/2018 10:01 AM

## 2018-04-10 LAB — JAK2 GENOTYPR

## 2018-04-16 LAB — BCR-ABL1 FISH
Cells Analyzed: 200
Cells Counted: 200

## 2018-04-20 ENCOUNTER — Inpatient Hospital Stay: Payer: BLUE CROSS/BLUE SHIELD | Attending: Oncology | Admitting: Oncology

## 2018-04-20 ENCOUNTER — Encounter: Payer: Self-pay | Admitting: Oncology

## 2018-04-20 VITALS — BP 133/88 | HR 89 | Temp 97.8°F | Resp 18 | Ht 71.0 in | Wt 236.6 lb

## 2018-04-20 DIAGNOSIS — M129 Arthropathy, unspecified: Secondary | ICD-10-CM

## 2018-04-20 DIAGNOSIS — R948 Abnormal results of function studies of other organs and systems: Secondary | ICD-10-CM | POA: Diagnosis not present

## 2018-04-20 DIAGNOSIS — N400 Enlarged prostate without lower urinary tract symptoms: Secondary | ICD-10-CM

## 2018-04-20 DIAGNOSIS — Z87442 Personal history of urinary calculi: Secondary | ICD-10-CM | POA: Diagnosis not present

## 2018-04-20 DIAGNOSIS — D72829 Elevated white blood cell count, unspecified: Secondary | ICD-10-CM

## 2018-04-20 DIAGNOSIS — F17210 Nicotine dependence, cigarettes, uncomplicated: Secondary | ICD-10-CM | POA: Diagnosis not present

## 2018-04-20 DIAGNOSIS — Z79899 Other long term (current) drug therapy: Secondary | ICD-10-CM | POA: Diagnosis not present

## 2018-04-20 DIAGNOSIS — D729 Disorder of white blood cells, unspecified: Secondary | ICD-10-CM

## 2018-04-20 DIAGNOSIS — Q8501 Neurofibromatosis, type 1: Secondary | ICD-10-CM

## 2018-04-20 NOTE — Progress Notes (Signed)
Hematology/Oncology Consult note [facility]  Telephone:([phone] Fax:([phone]) [phone]  Patient Care Team: Valera Castle, MD as PCP - General (Family Medicine)   Name of the patient: [name]  [id number]  Jun 12, 1971   Date of visit: 04/20/18  Diagnosis- leucocytosis likely reactive   Chief complaint/ Reason for visit- discuss results of blood work  Heme/Onc history: patient is a 47 year old male with a past medical history significant for neurofibromatosis type I who was seen by [facility] GI for abnormal LFTs.  At that time he was noted to have elevated white count and has been referred to [place] for further evaluation.  Patient smokes about half a pack of cigarettes per day for the last 25 years.  Patient has had chronically elevated WBC count since 2016 which has been gradually trending up from 11.7 in 20 16-12 0.7-13.1 and most recently his CBC from 03/30/2018 showed white count of 14.6, H&H of 17/49.2 and a platelet count of 257.  Differential mainly showed neutrophilia and on occasions he has had some basal failure and monocytosis.  His hemoglobin has been typically ranging between 16.5-17.  He follows up with Dr. Melrose Nakayama from neurology for neurofibromatosis type I.  Results of blood from 04/04/2018 were as follows: Showed white count of 5.7, H&H of 16.9/48.1 with a platelet count of 245.  Differential should be neutrophilia with an absolute count of 8.6 and mild monocytosis with a monocyte count of 1.1.  Pathology smear review showed unremarkable WBC RBC and platelet morphology.  Flow cytometry showed no monoclonal B-cell population and no evidence of immunophenotypic abnormality or abnormal myeloid maturation.  FISH for BCR able testing was negative.  E Po levels were normal and Jak 2 mutation testing was negative  Interval history- he reports doing well. Denies any complaints today. He is concerned about his weight gain. He continues to smoke and  finds it difficult to stop smoking. He has started taking Wellbutrin for the same  ECOG PS- 0 Pain scale- 0   Review of systems- Review of Systems  Constitutional: Negative for chills, fever, malaise/fatigue and weight loss.  HENT: Negative for congestion, ear discharge and nosebleeds.   Eyes: Negative for blurred vision.  Respiratory: Negative for cough, hemoptysis, sputum production, shortness of breath and wheezing.   Cardiovascular: Negative for chest pain, palpitations, orthopnea and claudication.  Gastrointestinal: Negative for abdominal pain, blood in stool, constipation, diarrhea, heartburn, melena, nausea and vomiting.  Genitourinary: Negative for dysuria, flank pain, frequency, hematuria and urgency.  Musculoskeletal: Negative for back pain, joint pain and myalgias.  Skin: Negative for rash.  Neurological: Negative for dizziness, tingling, focal weakness, seizures, weakness and headaches.  Endo/Heme/Allergies: Does not bruise/bleed easily.  Psychiatric/Behavioral: Negative for depression and suicidal ideas. The patient does not have insomnia.       No Known Allergies   Past Medical History:  Diagnosis Date  . Arthritis   . BPH (benign prostatic hyperplasia)   . Hematuria    microscopic  . History of hemorrhoids   . Kidney stone 2016  . Neurofibromatosis type 1-like syndrome   . Smoker      Past Surgical History:  Procedure Laterality Date  . acl reconstructed    . HEMORRHOID SURGERY    . MOUTH SURGERY    . neuro fiber mitosis      Social History   Socioeconomic History  . Marital status: Married    Spouse name: Not on file  . Number of children:  Not on file  . Years of education: Not on file  . Highest education level: Not on file  Occupational History  . Not on file  Social Needs  . Financial resource strain: Not on file  . Food insecurity:    Worry: Not on file    Inability: Not on file  . Transportation needs:    Medical: Not on file     Non-medical: Not on file  Tobacco Use  . Smoking status: Current Every Day Smoker  . Smokeless tobacco: Never Used  Substance and Sexual Activity  . Alcohol use: Yes    Alcohol/week: 0.0 oz    Comment: Beer  . Drug use: Not on file  . Sexual activity: Not on file  Lifestyle  . Physical activity:    Days per week: Not on file    Minutes per session: Not on file  . Stress: Not on file  Relationships  . Social connections:    Talks on phone: Not on file    Gets together: Not on file    Attends religious service: Not on file    Active member of club or organization: Not on file    Attends meetings of clubs or organizations: Not on file    Relationship status: Not on file  . Intimate partner violence:    Fear of current or ex partner: Not on file    Emotionally abused: Not on file    Physically abused: Not on file    Forced sexual activity: Not on file  Other Topics Concern  . Not on file  Social History Narrative  . Not on file    Family History  Problem Relation Age of Onset  . Stroke Father   . Diabetes Mellitus II Father   . Depression Mother   . Coronary artery disease Unknown        mother,father,maternal side,paternal side     Current Outpatient Medications:  .  atorvastatin (LIPITOR) 40 MG tablet, Take by mouth., Disp: , Rfl:  .  Cranberry 500 MG CAPS, Take by mouth., Disp: , Rfl:  .  diclofenac (VOLTAREN) 75 MG EC tablet, Take 75 mg by mouth 2 (two) times daily., Disp: , Rfl:  .  gabapentin (NEURONTIN) 100 MG capsule, , Disp: , Rfl:  .  hydrOXYzine (ATARAX/VISTARIL) 25 MG tablet, Take 25 mg by mouth daily., Disp: , Rfl:  .  MULTIPLE VITAMINS PO, Take 1 tablet by mouth daily., Disp: , Rfl:  .  nortriptyline (PAMELOR) 50 MG capsule, Take 50 mg by mouth at bedtime., Disp: , Rfl:  .  Turmeric Curcumin 500 MG CAPS, Take by mouth., Disp: , Rfl:   Physical exam:  Vitals:   04/20/18 0843  BP: 133/88  Pulse: 89  Resp: 18  Temp: 97.8 F (36.6 C)  SpO2: 97%    Weight: 236 lb 8.9 oz (107.3 kg)  Height: 5' 11"  (1.803 m)   Physical Exam  Constitutional: He is oriented to person, place, and time. He appears well-developed and well-nourished.  HENT:  Head: Normocephalic and atraumatic.  Eyes: Pupils are equal, round, and reactive to light. EOM are normal.  Neck: Normal range of motion.  Cardiovascular: Normal rate, regular rhythm and normal heart sounds.  Pulmonary/Chest: Effort normal and breath sounds normal.  Abdominal: Soft. Bowel sounds are normal.  Neurological: He is alert and oriented to person, place, and time.  Skin: Skin is warm and dry.     No flowsheet data found. CBC Latest Ref  Rng & Units 04/06/2018  WBC 3.8 - 10.6 K/uL 12.7(H)  Hemoglobin 13.0 - 18.0 g/dL 16.9  Hematocrit 40.0 - 52.0 % 48.1  Platelets 150 - 440 K/uL 245    No images are attached to the encounter.  US Abdomen Limited Ruq  Result Date: 04/06/2018 CLINICAL DATA:  47 year old male with elevated liver function studies. Initial encounter. EXAM: ULTRASOUND ABDOMEN LIMITED RIGHT UPPER QUADRANT COMPARISON:  08/29/2014 CT. FINDINGS: Gallbladder: No gallstones or wall thickening visualized. No sonographic Murphy sign noted by sonographer. Common bile duct: Diameter: 4.3 mm Liver: Diffuse increased echogenicity and enlargement consistent with enlarged fatty liver as noted on prior CT. Focal fatty sparing adjacent to gallbladder fossa without worrisome hepatic lesion noted. Portal vein is patent on color Doppler imaging with normal direction of blood flow towards the liver. IMPRESSION: Enlarged fatty liver. Focal fatty sparing adjacent to the gallbladder fossa. No other liver lesion noted. Gallbladder unremarkable. Electronically Signed   By: Genia Del M.D.   On: 04/06/2018 09:57     Assessment and plan- Patient is a 47 y.o. male referred for leukocytosis likely reactive  Discussed results of blood work including flow cytometry and BCR able testing and Jak 2 mutation  testing which were unremarkable.  Patient has mild leukocytosis with predominant neutrophilia and is likely reactive due to his smoking.  I am inclined to monitor this conservatively without any further need for bone marrow biopsy at this time.  Repeat CBC with differential in 6 months in 1 year and I will see him back in 1 year   Visit Diagnosis 1. Neutrophilia      Dr. Randa Evens, MD, MPH [facility] [phone] 04/20/2018 10:21 AM

## 2018-10-19 ENCOUNTER — Inpatient Hospital Stay: Payer: BLUE CROSS/BLUE SHIELD | Attending: Oncology

## 2018-10-19 DIAGNOSIS — D72829 Elevated white blood cell count, unspecified: Secondary | ICD-10-CM | POA: Insufficient documentation

## 2018-10-19 DIAGNOSIS — D729 Disorder of white blood cells, unspecified: Secondary | ICD-10-CM

## 2018-10-19 LAB — CBC WITH DIFFERENTIAL/PLATELET
ABS IMMATURE GRANULOCYTES: 0.05 10*3/uL (ref 0.00–0.07)
BASOS ABS: 0.1 10*3/uL (ref 0.0–0.1)
BASOS PCT: 1 %
EOS ABS: 0.5 10*3/uL (ref 0.0–0.5)
Eosinophils Relative: 4 %
HCT: 46 % (ref 39.0–52.0)
Hemoglobin: 16.2 g/dL (ref 13.0–17.0)
IMMATURE GRANULOCYTES: 0 %
Lymphocytes Relative: 29 %
Lymphs Abs: 3.4 10*3/uL (ref 0.7–4.0)
MCH: 30.2 pg (ref 26.0–34.0)
MCHC: 35.2 g/dL (ref 30.0–36.0)
MCV: 85.7 fL (ref 80.0–100.0)
Monocytes Absolute: 1.1 10*3/uL — ABNORMAL HIGH (ref 0.1–1.0)
Monocytes Relative: 9 %
NEUTROS ABS: 6.7 10*3/uL (ref 1.7–7.7)
NEUTROS PCT: 57 %
NRBC: 0 % (ref 0.0–0.2)
PLATELETS: 264 10*3/uL (ref 150–400)
RBC: 5.37 MIL/uL (ref 4.22–5.81)
RDW: 12.4 % (ref 11.5–15.5)
WBC: 11.9 10*3/uL — ABNORMAL HIGH (ref 4.0–10.5)

## 2019-04-26 ENCOUNTER — Other Ambulatory Visit: Payer: Self-pay

## 2019-04-26 ENCOUNTER — Ambulatory Visit: Payer: Self-pay | Admitting: Oncology

## 2019-04-29 ENCOUNTER — Ambulatory Visit: Payer: Self-pay | Admitting: Hematology and Oncology

## 2019-04-29 ENCOUNTER — Other Ambulatory Visit: Payer: Self-pay

## 2019-07-29 ENCOUNTER — Ambulatory Visit: Payer: BLUE CROSS/BLUE SHIELD | Admitting: Hematology and Oncology

## 2019-07-29 ENCOUNTER — Other Ambulatory Visit: Payer: BLUE CROSS/BLUE SHIELD

## 2019-08-10 NOTE — Progress Notes (Signed)
[facility]  [address], [address] [place], [place] [address] Phone: [phone]  Fax: [phone]   Clinic Day:  08/12/2019  Referring physician: Valera Castle, *  Chief Complaint: [name]. is a 48 y.o. male with mild leucocytosis who is seen for new patient assessment.   HPI:   The patient has a history of an elevated WBC since 2016.  WBC ranged from 11,700 to 14,600 from 07/2015 - 03/2018.  He was referred to Dr Janese Banks on 04/06/2018.  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  Work-up on 04/06/2018 revealed a hematocrit of 48.1, hemoglobin 16.9, MCV 85.6, platelets 245,000, white count 12,700 with an [place] of 8600.  Differential included 67% segs, 21% lymphs, 9% monocytes, 2% eosinophils and 1% basophils.  Absolute monocyte count was 1100.  BCR-ABL was negative.  JAK2 V617F was negative.  Epo level was 6.2 (normal).  Flow cytometry revealed no significant immunophenotypic abnormality.  Pathology smear revealed unremarkable WBC, RBC and platelet morphology.    He was last seen in the hematology clinic by Dr. Janese Banks on 04/20/2018. At that time he was doing well and had no complaints. He continued to smoke.    Labs on 10/19/2018: hematocrit 46.0, hemoglobin 16.2, platelets 264,000, WBC 11,900.  Absolute monocyte count was 1100.  Symptomatically, he is doing well.  He notes having poor eye sight. He has occasional right knee pain which he attribute to a ACL replacement a few years back. He has occasional "throbbing" neck pain. Since cutting back on smoking cigarettes, he has noticed less episodes of shortness of breath. He reports having a cough but it is rare. He notes vaping since 07/2018.    He denies any oral steroids, steroid injections, or infections. He denies a history of hepatitis. He has been taking herbal products x 6 month for his prostate health. He was taking tumeric but had since stopped.  The  patient has neurofibromatosis type I and follows up with Dr. Melrose Nakayama from neurology. He reports having multiple tumors throughout his entire body. He reports seeing a hand specialist a few months ago for a tumor in his hand. He reports a "touch of scoliosis". He notes having occasional headaches which he attributes to his NF.    Past Medical History:  Diagnosis Date  . Arthritis   . BPH (benign prostatic hyperplasia)   . Hematuria    microscopic  . History of hemorrhoids   . Kidney stone 2016  . Neurofibromatosis type 1-like syndrome   . Smoker     Past Surgical History:  Procedure Laterality Date  . acl reconstructed    . HEMORRHOID SURGERY    . MOUTH SURGERY    . neuro fiber mitosis      Family History  Problem Relation Age of Onset  . Stroke Father   . Diabetes Mellitus II Father   . Depression Mother   . Coronary artery disease Unknown        mother,father,maternal side,paternal side    Social History:  reports that he has been smoking. He has never used smokeless tobacco. He reports current alcohol use. No history on file for drug. He drank alcohol heavily in his 20s and 30s but has not drank heavily in several years. He occasionally drinks beer and shots of liquor. He has been smoking for 25+ years. He started smoking at age 31. He cut back on smoking and now smokes cigarettes "every now and then". He  has been vaping since 07/2018.  He is married. He works for [employer] as a Administrator. He denies any exposure to toxins and radiation. The patient is alone today.  Allergies: No Known Allergies  Current Medications: Current Outpatient Medications  Medication Sig Dispense Refill  . atorvastatin (LIPITOR) 40 MG tablet Take 40 mg by mouth daily at 6 PM.     . hydrOXYzine (ATARAX/VISTARIL) 25 MG tablet Take 25 mg by mouth daily.    Marland Kitchen lisinopril (ZESTRIL) 5 MG tablet Take 5 mg by mouth daily.    . Lycopene 10 MG CAPS Take 1 capsule by mouth 2 (two) times  daily.    . MULTIPLE VITAMINS PO Take 1 tablet by mouth daily.    . nortriptyline (PAMELOR) 50 MG capsule Take by mouth 2 (two) times daily. 20 MG AM, 50 MG PM    . Omega-3 Fatty Acids (FISH OIL) 1000 MG CAPS Take 1 capsule by mouth daily.    . Saw Palmetto 450 MG CAPS Take 1 capsule by mouth 2 (two) times daily.     No current facility-administered medications for this visit.     Review of Systems  Constitutional: Negative.  Negative for chills, diaphoresis, fever, malaise/fatigue and weight loss.       Doing well.  HENT: Negative.  Negative for congestion, ear pain, hearing loss, nosebleeds, sinus pain, sore throat and tinnitus.   Eyes: Negative for blurred vision, double vision and pain.       Poor eye sight.  Respiratory: Positive for cough (rare) and shortness of breath (cut back on smoking, improving.). Negative for hemoptysis and sputum production.        Vapes. Smokes cigarette from time to time.  Cardiovascular: Negative.  Negative for chest pain, palpitations and leg swelling.  Gastrointestinal: Negative.  Negative for abdominal pain, blood in stool, constipation, diarrhea, melena, nausea and vomiting.  Genitourinary: Negative.  Negative for dysuria, frequency, hematuria and urgency.  Musculoskeletal: Positive for joint pain (right knee, occasional) and neck pain (occasional). Negative for back pain and myalgias.       ACL replacement a few years back.  Skin: Negative for itching and rash.       Multiple tumors throughout the entire body.  Neurological: Positive for headaches (occasional). Negative for dizziness, tingling, sensory change, focal weakness and weakness.       Neurofibromatosis type 1.  Endo/Heme/Allergies: Negative.  Does not bruise/bleed easily.  Psychiatric/Behavioral: Negative.  Negative for depression and memory loss. The patient is not nervous/anxious and does not have insomnia.   All other systems reviewed and are negative.  Performance status (ECOG):  0   Vitals Blood pressure (!) 131/94, pulse 72, resp. rate 18, height 5' 10"  (1.778 m), weight 233 lb 5.7 oz (105.8 kg), SpO2 100 %.   Physical Exam  Constitutional: He is oriented to person, place, and time. He appears well-developed and well-nourished. No distress.  HENT:  Head: Normocephalic and atraumatic.  Mouth/Throat: Oropharynx is clear and moist. No oropharyngeal exudate.  Brown hair.  Mask.  Eyes: Pupils are equal, round, and reactive to light. Conjunctivae and EOM are normal. No scleral icterus.  Brown eyes.  Neck: Normal range of motion. Neck supple. No JVD present.  Cardiovascular: Normal rate, regular rhythm and normal heart sounds.  No murmur heard. Pulmonary/Chest: Effort normal and breath sounds normal. No respiratory distress. He has no wheezes. He has no rales. He exhibits no tenderness.  Abdominal: Soft. Bowel sounds are  normal. He exhibits no distension and no mass. There is no abdominal tenderness. There is no rebound and no guarding.  Musculoskeletal: Normal range of motion.        General: No tenderness or edema.  Lymphadenopathy:    He has no cervical adenopathy.    He has no axillary adenopathy.       Right: No supraclavicular adenopathy present.       Left: No supraclavicular adenopathy present.  Neurological: He is alert and oriented to person, place, and time.  Skin: Skin is warm and dry. No rash noted. He is not diaphoretic. No erythema. No pallor.  Multiple nodules from neurofibromatosis.  Psychiatric: He has a normal mood and affect. His behavior is normal. Judgment and thought content normal.  Nursing note and vitals reviewed.   Appointment on 08/12/2019  Component Date Value Ref Range Status  . Sodium 08/12/2019 136  135 - 145 mmol/L Final  . Potassium 08/12/2019 3.6  3.5 - 5.1 mmol/L Final  . Chloride 08/12/2019 103  98 - 111 mmol/L Final  . CO2 08/12/2019 25  22 - 32 mmol/L Final  . Glucose, Bld 08/12/2019 91  70 - 99 mg/dL Final  . BUN 08/12/2019  15  6 - 20 mg/dL Final  . Creatinine, Ser 08/12/2019 0.88  0.61 - 1.24 mg/dL Final  . Calcium 08/12/2019 8.9  8.9 - 10.3 mg/dL Final  . Total Protein 08/12/2019 7.6  6.5 - 8.1 g/dL Final  . Albumin 08/12/2019 4.1  3.5 - 5.0 g/dL Final  . AST 08/12/2019 43* 15 - 41 U/L Final  . ALT 08/12/2019 63* 0 - 44 U/L Final  . Alkaline Phosphatase 08/12/2019 64  38 - 126 U/L Final  . Total Bilirubin 08/12/2019 0.6  0.3 - 1.2 mg/dL Final  . GFR calc non Af Amer 08/12/2019 >60  >60 mL/min Final  . GFR calc Af Amer 08/12/2019 >60  >60 mL/min Final  . Anion gap 08/12/2019 8  5 - 15 Final   Performed at [facility], [address] [place], [place] [address]  . WBC 08/12/2019 8.9  4.0 - 10.5 K/uL Final  . RBC 08/12/2019 5.09  4.22 - 5.81 MIL/uL Final  . Hemoglobin 08/12/2019 15.2  13.0 - 17.0 g/dL Final  . HCT 08/12/2019 42.4  39.0 - 52.0 % Final  . MCV 08/12/2019 83.3  80.0 - 100.0 fL Final  . MCH 08/12/2019 29.9  26.0 - 34.0 pg Final  . MCHC 08/12/2019 35.8  30.0 - 36.0 g/dL Final  . RDW 08/12/2019 12.7  11.5 - 15.5 % Final  . Platelets 08/12/2019 235  150 - 400 K/uL Final  . nRBC 08/12/2019 0.0  0.0 - 0.2 % Final  . Neutrophils Relative % 08/12/2019 56  % Final  . Neutro Abs 08/12/2019 5.1  1.7 - 7.7 K/uL Final  . Lymphocytes Relative 08/12/2019 28  % Final  . Lymphs Abs 08/12/2019 2.4  0.7 - 4.0 K/uL Final  . Monocytes Relative 08/12/2019 11  % Final  . Monocytes Absolute 08/12/2019 0.9  0.1 - 1.0 K/uL Final  . Eosinophils Relative 08/12/2019 4  % Final  . Eosinophils Absolute 08/12/2019 0.3  0.0 - 0.5 K/uL Final  . Basophils Relative 08/12/2019 1  % Final  . Basophils Absolute 08/12/2019 0.1  0.0 - 0.1 K/uL Final  . Immature Granulocytes 08/12/2019 0  % Final  . Abs Immature Granulocytes 08/12/2019 0.03  0.00 - 0.07 K/uL Final   Performed at  [facility], [address], [place], [place] [address]    Assessment:  Wesley Mcmanaman. is a 48 y.o. male with mild  leukocytosis since 2016.  WBC has ranged from 11,700 to 14,600 from 07/2015 - 03/2018.  Etiology is felt secondary to smoking.  Work-up on 04/06/2018 revealed a hematocrit of 48.1, hemoglobin 16.9, MCV 85.6, platelets 245,000, white count 12,700 with an [place] of 8600.  Differential included 67% segs, 21% lymphs, 9% monocytes, 2% eosinophils and 1% basophils.  Absolute monocyte count was 1100.  BCR-ABL was negative.  JAK2 V617F was negative.  Epo level was 6.2 (normal).  Flow cytometry revealed no significant immunophenotypic abnormality.  Pathology smear revealed unremarkable WBC, RBC and platelet morphology.   He has mild erythrocytosis.  Hemoglobin has ranged from 16.5 - 17.  He has neurofibromatosis type I.  Symptomatically, he is doing well.  He has cut back on his smoking.  Exam reveals no adenopathy or hepatosplenomegaly.  Hemoglobin is 15.2.  Plan: 1.   Labs today:  CBC with diff, CMP, carbon monoxide level, JAK2 exon 12-15. 2.   Mild leukocytosis  Review entire medical history, diagnosis and management of leukocytosis.   Etiology felt secondary to smoking.  WBC is no longer elevated.   WBC 8900 ([place] 5100) with a normal differential. 3.   Mild erythrocytosis  Review history of erythrocytosis.  Etiology felt secondary to smoking.  Hemoglobin has normalized with cutting back on smoking. 4.   RTC prn.  I discussed the assessment and treatment plan with the patient.  The patient was provided an opportunity to ask questions and all were answered.  The patient agreed with the plan and demonstrated an understanding of the instructions.  The patient was advised to call back if the symptoms worsen or if the condition fails to improve as anticipated.   Melissa C. Mike Gip, MD, PhD    08/12/2019, 11:24 AM  I, Selena Batten, am acting as scribe for [place]. Mike Gip, MD, PhD.  I, Melissa C. Mike Gip, MD, have reviewed the above documentation for accuracy and completeness, and I agree with  the above.

## 2019-08-11 ENCOUNTER — Other Ambulatory Visit: Payer: Self-pay

## 2019-08-11 ENCOUNTER — Other Ambulatory Visit: Payer: Self-pay | Admitting: Hematology and Oncology

## 2019-08-11 DIAGNOSIS — D751 Secondary polycythemia: Secondary | ICD-10-CM | POA: Insufficient documentation

## 2019-08-11 DIAGNOSIS — D72829 Elevated white blood cell count, unspecified: Secondary | ICD-10-CM | POA: Insufficient documentation

## 2019-08-11 DIAGNOSIS — Q8501 Neurofibromatosis, type 1: Secondary | ICD-10-CM

## 2019-08-11 NOTE — Progress Notes (Signed)
Confirmed Name, DOB, and Address. Previous Dr. Janese Banks patient. Denies any concerns.

## 2019-08-12 ENCOUNTER — Inpatient Hospital Stay (HOSPITAL_BASED_OUTPATIENT_CLINIC_OR_DEPARTMENT_OTHER): Payer: BC Managed Care – PPO | Admitting: Hematology and Oncology

## 2019-08-12 ENCOUNTER — Other Ambulatory Visit: Payer: Self-pay

## 2019-08-12 ENCOUNTER — Encounter: Payer: Self-pay | Admitting: Hematology and Oncology

## 2019-08-12 ENCOUNTER — Inpatient Hospital Stay: Payer: BC Managed Care – PPO | Attending: Hematology and Oncology

## 2019-08-12 VITALS — BP 131/94 | HR 72 | Resp 18 | Ht 70.0 in | Wt 233.4 lb

## 2019-08-12 DIAGNOSIS — R05 Cough: Secondary | ICD-10-CM | POA: Insufficient documentation

## 2019-08-12 DIAGNOSIS — Z87442 Personal history of urinary calculi: Secondary | ICD-10-CM | POA: Diagnosis not present

## 2019-08-12 DIAGNOSIS — M542 Cervicalgia: Secondary | ICD-10-CM | POA: Insufficient documentation

## 2019-08-12 DIAGNOSIS — R51 Headache: Secondary | ICD-10-CM | POA: Insufficient documentation

## 2019-08-12 DIAGNOSIS — D751 Secondary polycythemia: Secondary | ICD-10-CM | POA: Diagnosis not present

## 2019-08-12 DIAGNOSIS — D72829 Elevated white blood cell count, unspecified: Secondary | ICD-10-CM | POA: Insufficient documentation

## 2019-08-12 DIAGNOSIS — M1990 Unspecified osteoarthritis, unspecified site: Secondary | ICD-10-CM | POA: Insufficient documentation

## 2019-08-12 DIAGNOSIS — Z79899 Other long term (current) drug therapy: Secondary | ICD-10-CM | POA: Insufficient documentation

## 2019-08-12 DIAGNOSIS — M419 Scoliosis, unspecified: Secondary | ICD-10-CM | POA: Insufficient documentation

## 2019-08-12 DIAGNOSIS — D720 Genetic anomalies of leukocytes: Secondary | ICD-10-CM | POA: Diagnosis not present

## 2019-08-12 DIAGNOSIS — D729 Disorder of white blood cells, unspecified: Secondary | ICD-10-CM

## 2019-08-12 DIAGNOSIS — Q8501 Neurofibromatosis, type 1: Secondary | ICD-10-CM | POA: Diagnosis not present

## 2019-08-12 DIAGNOSIS — N400 Enlarged prostate without lower urinary tract symptoms: Secondary | ICD-10-CM | POA: Diagnosis not present

## 2019-08-12 LAB — CBC WITH DIFFERENTIAL/PLATELET
Abs Immature Granulocytes: 0.03 10*3/uL (ref 0.00–0.07)
Basophils Absolute: 0.1 10*3/uL (ref 0.0–0.1)
Basophils Relative: 1 %
Eosinophils Absolute: 0.3 10*3/uL (ref 0.0–0.5)
Eosinophils Relative: 4 %
HCT: 42.4 % (ref 39.0–52.0)
Hemoglobin: 15.2 g/dL (ref 13.0–17.0)
Immature Granulocytes: 0 %
Lymphocytes Relative: 28 %
Lymphs Abs: 2.4 10*3/uL (ref 0.7–4.0)
MCH: 29.9 pg (ref 26.0–34.0)
MCHC: 35.8 g/dL (ref 30.0–36.0)
MCV: 83.3 fL (ref 80.0–100.0)
Monocytes Absolute: 0.9 10*3/uL (ref 0.1–1.0)
Monocytes Relative: 11 %
Neutro Abs: 5.1 10*3/uL (ref 1.7–7.7)
Neutrophils Relative %: 56 %
Platelets: 235 10*3/uL (ref 150–400)
RBC: 5.09 MIL/uL (ref 4.22–5.81)
RDW: 12.7 % (ref 11.5–15.5)
WBC: 8.9 10*3/uL (ref 4.0–10.5)
nRBC: 0 % (ref 0.0–0.2)

## 2019-08-12 LAB — COMPREHENSIVE METABOLIC PANEL
ALT: 63 U/L — ABNORMAL HIGH (ref 0–44)
AST: 43 U/L — ABNORMAL HIGH (ref 15–41)
Albumin: 4.1 g/dL (ref 3.5–5.0)
Alkaline Phosphatase: 64 U/L (ref 38–126)
Anion gap: 8 (ref 5–15)
BUN: 15 mg/dL (ref 6–20)
CO2: 25 mmol/L (ref 22–32)
Calcium: 8.9 mg/dL (ref 8.9–10.3)
Chloride: 103 mmol/L (ref 98–111)
Creatinine, Ser: 0.88 mg/dL (ref 0.61–1.24)
GFR calc Af Amer: >60 mL/min (ref >60)
GFR calc non Af Amer: >60 mL/min (ref >60)
Glucose, Bld: 91 mg/dL (ref 70–99)
Potassium: 3.6 mmol/L (ref 3.5–5.1)
Sodium: 136 mmol/L (ref 135–145)
Total Bilirubin: 0.6 mg/dL (ref 0.3–1.2)
Total Protein: 7.6 g/dL (ref 6.5–8.1)

## 2019-08-12 NOTE — Progress Notes (Signed)
No new changes noted today 

## 2019-08-13 LAB — CARBON MONOXIDE, BLOOD (PERFORMED AT REF LAB): Carbon Monoxide, Blood: 3.5 % (ref 0.0–3.6)

## 2019-08-20 LAB — JAK2 EXONS 12-15

## 2019-11-08 IMAGING — US US ABDOMEN LIMITED
1 series · 14 of 25 positions shown · non-contrast
Comparison: 08/29/2014 CT.

CLINICAL DATA: 46-year-old male with elevated liver function
studies. Initial encounter.

EXAM:
ULTRASOUND ABDOMEN LIMITED RIGHT UPPER QUADRANT

[Series 1: us abdomen limited · 14 of 49 slices shown]
[im 1/49]
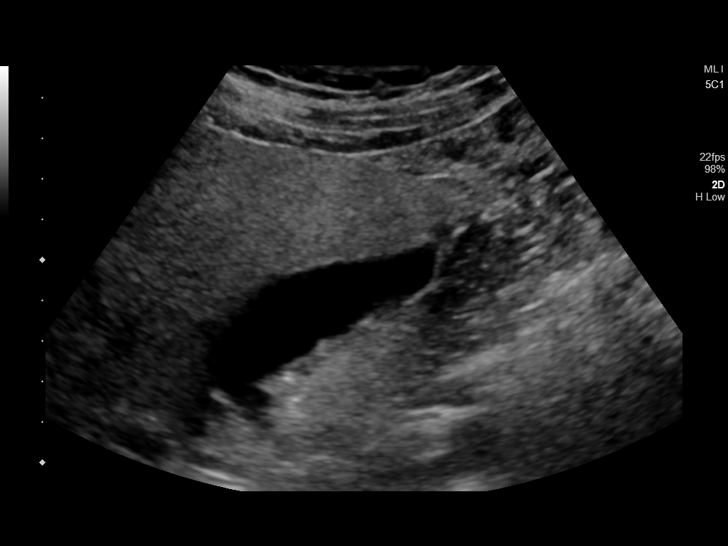
[im 5/49]
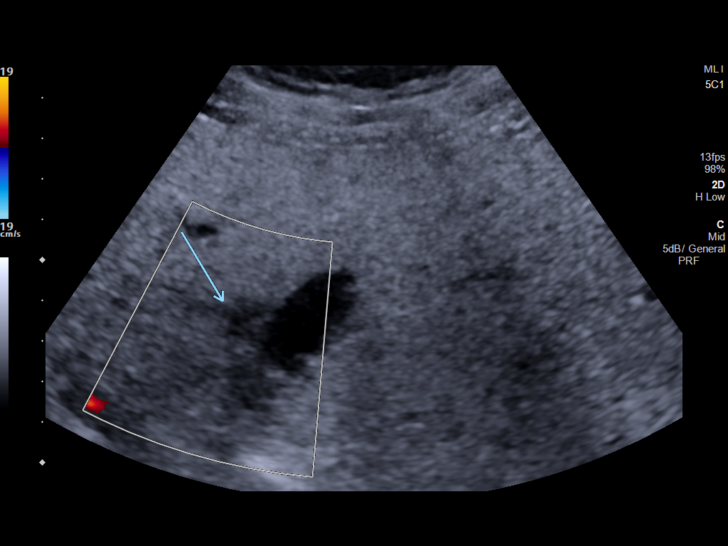
[im 9/49]
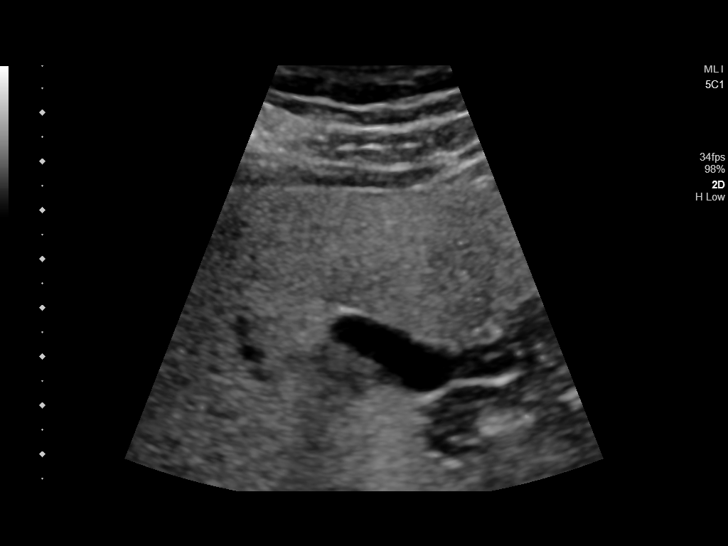
[im 13/49]
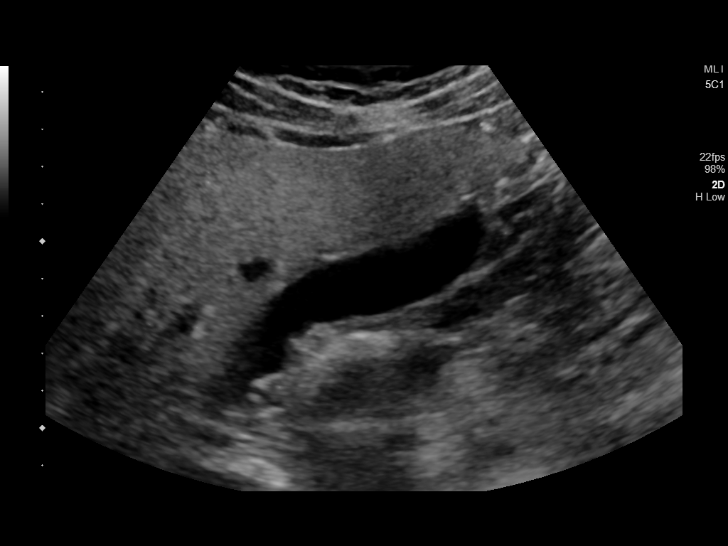
[im 17/49]
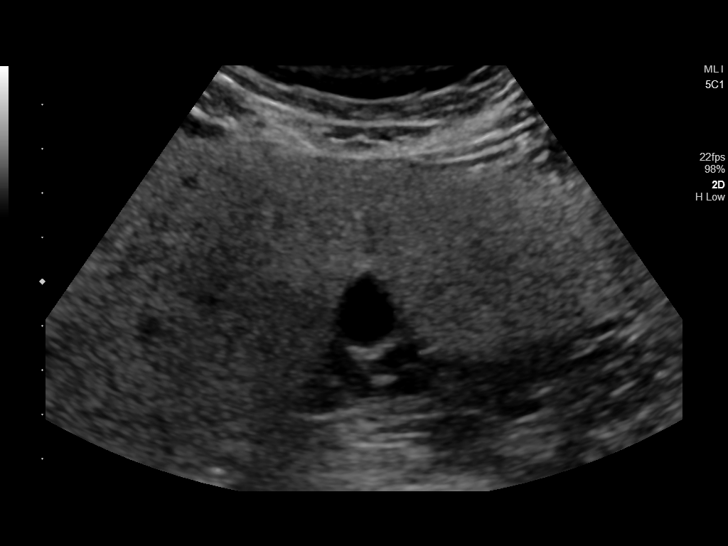
[im 19/49]
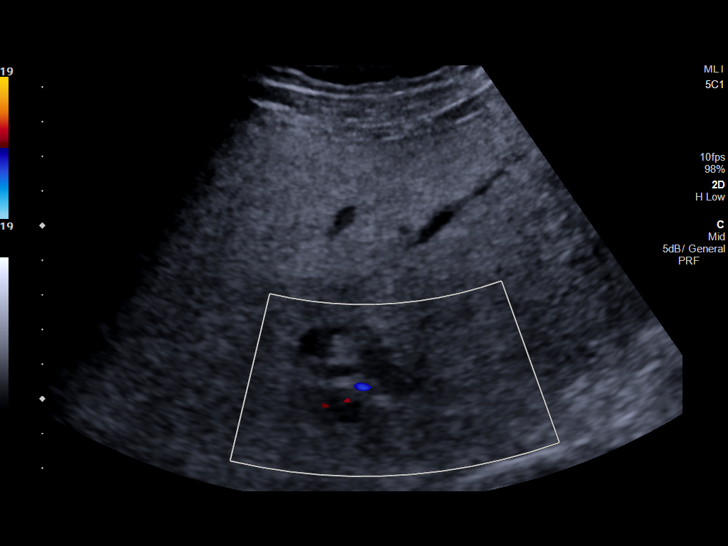
[im 23/49]
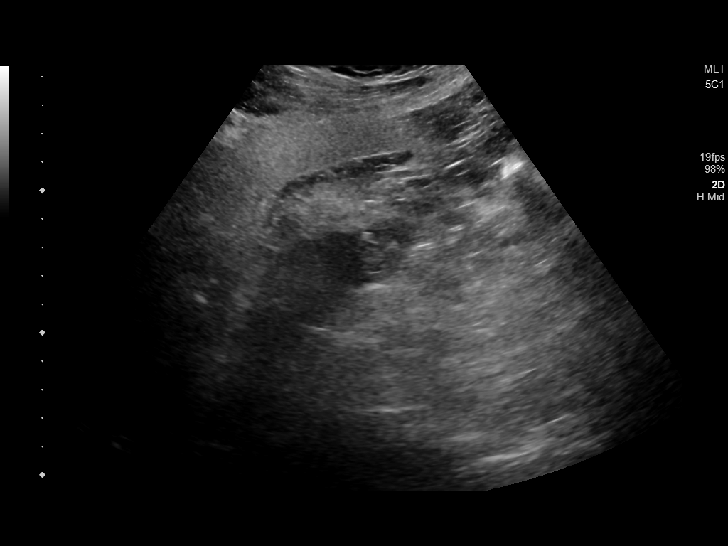
[im 27/49]
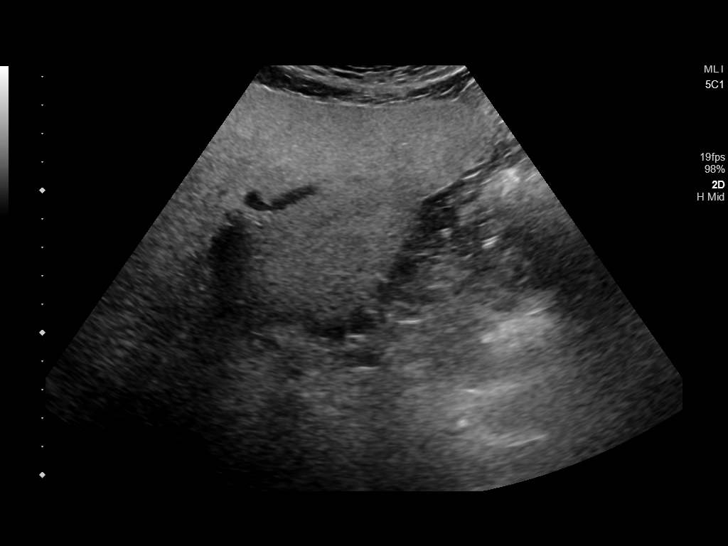
[im 31/49]
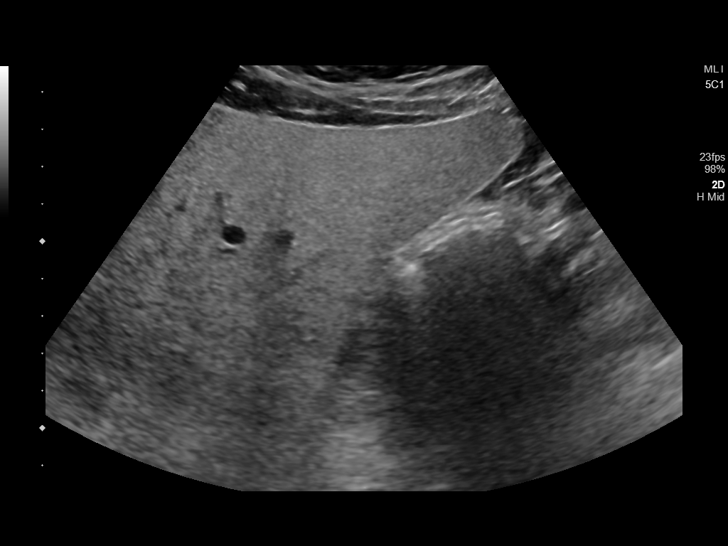
[im 33/49]
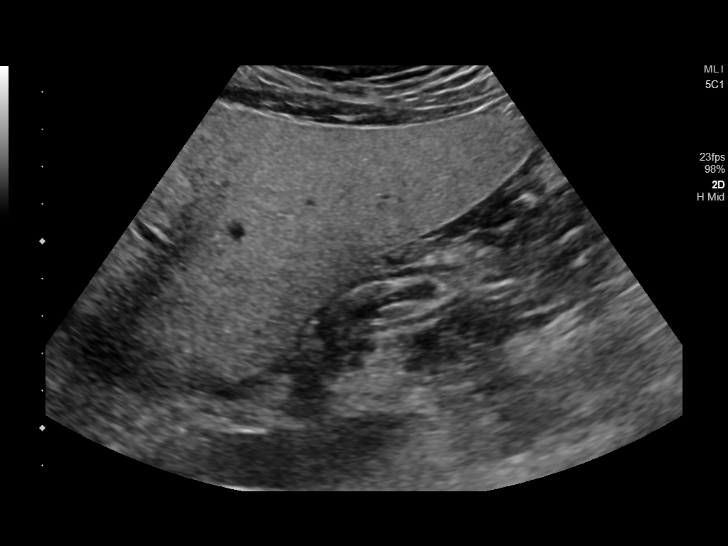
[im 37/49]
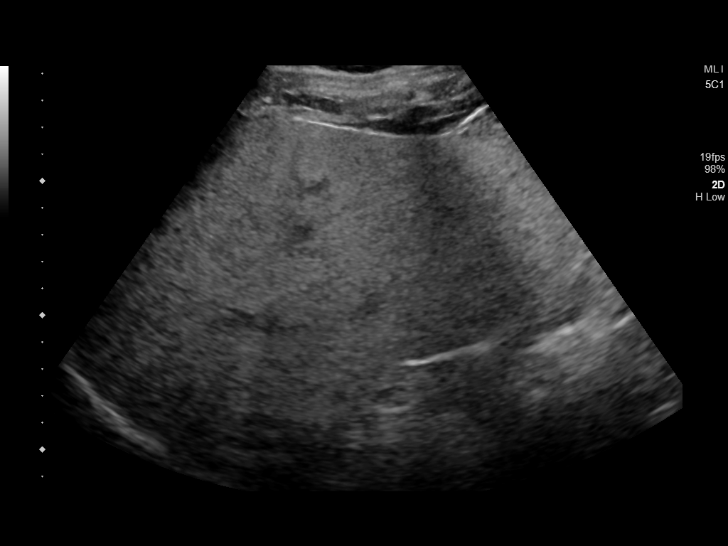
[im 41/49]
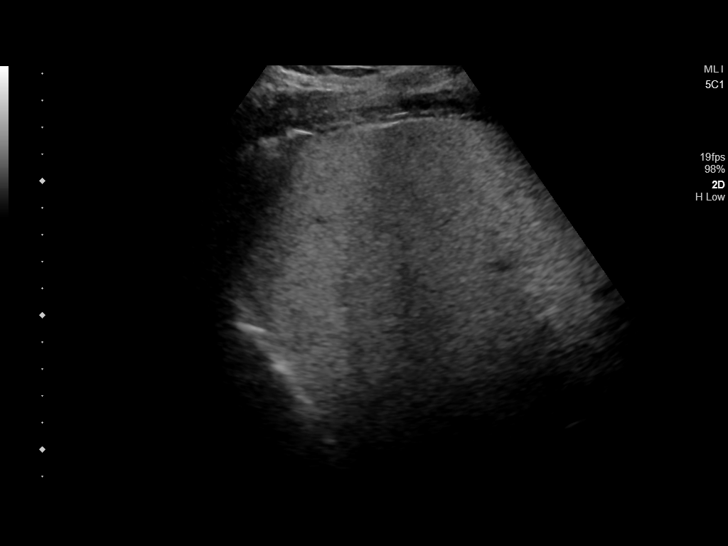
[im 45/49]
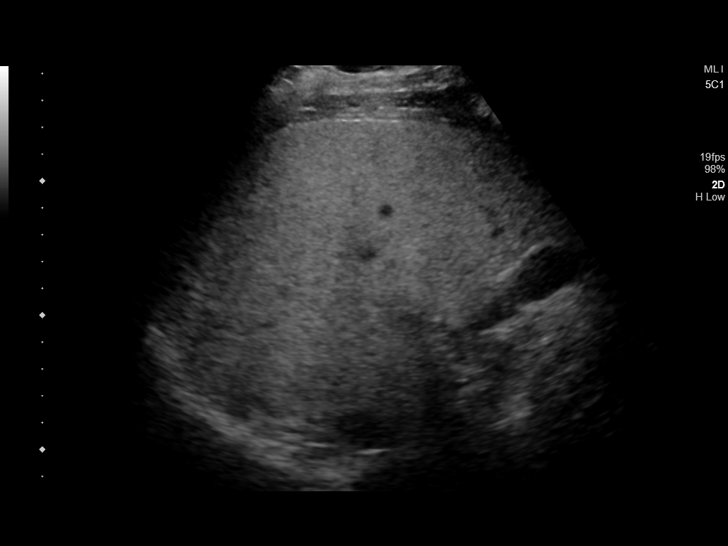
[im 49/49]
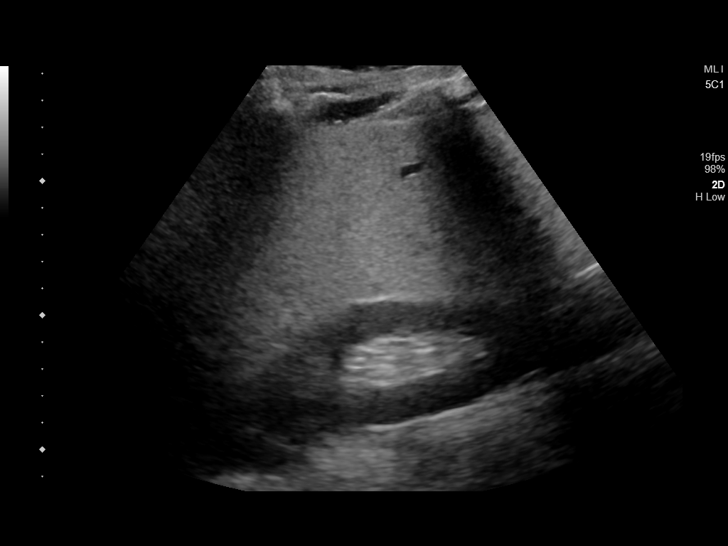

[14 of 25 positions shown; findings below may reference images not displayed]

FINDINGS: Gallbladder:

No gallstones or wall thickening visualized. No sonographic Murphy
sign noted by sonographer.

Common bile duct:

Diameter: 4.3 mm

Liver:

Diffuse increased echogenicity and enlargement consistent with
enlarged fatty liver as noted on prior CT. Focal fatty sparing
adjacent to gallbladder fossa without worrisome hepatic lesion
noted. Portal vein is patent on color Doppler imaging with normal
direction of blood flow towards the liver.
IMPRESSION: Enlarged fatty liver. Focal fatty sparing adjacent to the
gallbladder fossa. No other liver lesion noted.

Gallbladder unremarkable.
